# Patient Record
Sex: Male | Born: 1970 | Race: White | Hispanic: No | Marital: Married | Smoking: Former smoker
Health system: Southern US, Community
[De-identification: ages and names within clinical notes are randomized; demographics above are authoritative.]

## PROBLEM LIST (undated history)

## (undated) DIAGNOSIS — E0590 Thyrotoxicosis, unspecified without thyrotoxic crisis or storm: Secondary | ICD-10-CM

## (undated) DIAGNOSIS — E785 Hyperlipidemia, unspecified: Secondary | ICD-10-CM

## (undated) DIAGNOSIS — T7840XA Allergy, unspecified, initial encounter: Secondary | ICD-10-CM

## (undated) DIAGNOSIS — E042 Nontoxic multinodular goiter: Secondary | ICD-10-CM

## (undated) DIAGNOSIS — B159 Hepatitis A without hepatic coma: Secondary | ICD-10-CM

## (undated) DIAGNOSIS — A6000 Herpesviral infection of urogenital system, unspecified: Secondary | ICD-10-CM

## (undated) HISTORY — PX: FOOT SURGERY: SHX648

## (undated) HISTORY — PX: TONSILLECTOMY: SUR1361

## (undated) HISTORY — DX: Thyrotoxicosis, unspecified without thyrotoxic crisis or storm: E05.90

## (undated) HISTORY — DX: Nontoxic multinodular goiter: E04.2

## (undated) HISTORY — DX: Hepatitis a without hepatic coma: B15.9

## (undated) HISTORY — DX: Herpesviral infection of urogenital system, unspecified: A60.00

## (undated) HISTORY — DX: Allergy, unspecified, initial encounter: T78.40XA

---
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
## 2005-04-29 ENCOUNTER — Emergency Department (HOSPITAL_COMMUNITY): Admission: EM | Admit: 2005-04-29 | Discharge: 2005-04-29 | Payer: Self-pay | Admitting: Emergency Medicine

## 2005-06-03 ENCOUNTER — Ambulatory Visit: Payer: Self-pay | Admitting: Internal Medicine

## 2005-06-29 ENCOUNTER — Ambulatory Visit: Payer: Self-pay | Admitting: Internal Medicine

## 2005-08-12 ENCOUNTER — Ambulatory Visit: Payer: Self-pay | Admitting: Internal Medicine

## 2006-03-08 ENCOUNTER — Ambulatory Visit: Payer: Self-pay | Admitting: Endocrinology

## 2006-07-08 ENCOUNTER — Ambulatory Visit: Payer: Self-pay | Admitting: Internal Medicine

## 2007-01-11 ENCOUNTER — Ambulatory Visit: Payer: Self-pay | Admitting: Pulmonary Disease

## 2007-01-14 ENCOUNTER — Ambulatory Visit: Payer: Self-pay | Admitting: Family Medicine

## 2007-01-25 ENCOUNTER — Ambulatory Visit: Payer: Self-pay | Admitting: Internal Medicine

## 2007-01-25 LAB — CONVERTED CEMR LAB
T3, Free: 3.4 pg/mL (ref 2.3–4.2)
T4, Total: 8.1 ug/dL (ref 5.0–12.5)

## 2007-03-07 ENCOUNTER — Encounter: Admission: RE | Admit: 2007-03-07 | Discharge: 2007-03-07 | Payer: Self-pay | Admitting: Internal Medicine

## 2007-03-15 ENCOUNTER — Encounter: Admission: RE | Admit: 2007-03-15 | Discharge: 2007-03-15 | Payer: Self-pay | Admitting: Internal Medicine

## 2007-04-06 ENCOUNTER — Encounter: Admission: RE | Admit: 2007-04-06 | Discharge: 2007-04-06 | Payer: Self-pay | Admitting: Internal Medicine

## 2007-04-06 ENCOUNTER — Other Ambulatory Visit: Admission: RE | Admit: 2007-04-06 | Discharge: 2007-04-06 | Payer: Self-pay | Admitting: Interventional Radiology

## 2007-04-06 ENCOUNTER — Encounter (INDEPENDENT_AMBULATORY_CARE_PROVIDER_SITE_OTHER): Payer: Self-pay | Admitting: Interventional Radiology

## 2007-05-12 ENCOUNTER — Encounter: Payer: Self-pay | Admitting: *Deleted

## 2007-05-12 DIAGNOSIS — A6000 Herpesviral infection of urogenital system, unspecified: Secondary | ICD-10-CM | POA: Insufficient documentation

## 2007-05-12 DIAGNOSIS — J309 Allergic rhinitis, unspecified: Secondary | ICD-10-CM | POA: Insufficient documentation

## 2007-05-12 HISTORY — DX: Herpesviral infection of urogenital system, unspecified: A60.00

## 2007-05-18 ENCOUNTER — Encounter: Payer: Self-pay | Admitting: Internal Medicine

## 2007-05-18 ENCOUNTER — Ambulatory Visit: Payer: Self-pay | Admitting: Internal Medicine

## 2007-05-18 DIAGNOSIS — E0590 Thyrotoxicosis, unspecified without thyrotoxic crisis or storm: Secondary | ICD-10-CM

## 2007-05-18 HISTORY — DX: Thyrotoxicosis, unspecified without thyrotoxic crisis or storm: E05.90

## 2007-06-07 ENCOUNTER — Encounter: Payer: Self-pay | Admitting: Endocrinology

## 2007-06-07 ENCOUNTER — Ambulatory Visit: Payer: Self-pay | Admitting: Endocrinology

## 2007-08-15 ENCOUNTER — Telehealth (INDEPENDENT_AMBULATORY_CARE_PROVIDER_SITE_OTHER): Payer: Self-pay | Admitting: *Deleted

## 2007-08-17 ENCOUNTER — Ambulatory Visit: Payer: Self-pay | Admitting: Endocrinology

## 2007-08-18 ENCOUNTER — Telehealth (INDEPENDENT_AMBULATORY_CARE_PROVIDER_SITE_OTHER): Payer: Self-pay | Admitting: *Deleted

## 2007-08-23 ENCOUNTER — Encounter: Admission: RE | Admit: 2007-08-23 | Discharge: 2007-08-23 | Payer: Self-pay | Admitting: Endocrinology

## 2008-06-07 IMAGING — US US SOFT TISSUE HEAD/NECK
1 series · 14 of 25 positions shown · non-contrast
Comparison: Thyroid scan, 03/08/07.

CLINICAL DATA: Thyroid nodule and decreased TSH.
 THYROID ULTRASOUND:
TECHNIQUE: Ultrasound examination of the thyroid gland and adjacent soft tissue structures was performed.

[Series 1: us soft tissue head/neck · 0.07mm/px · 14 of 77 slices shown]
[im 1/77]
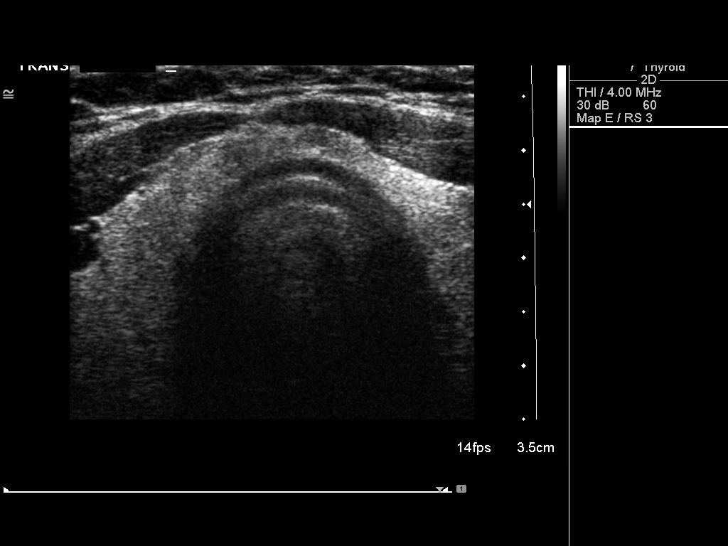
[im 7/77]
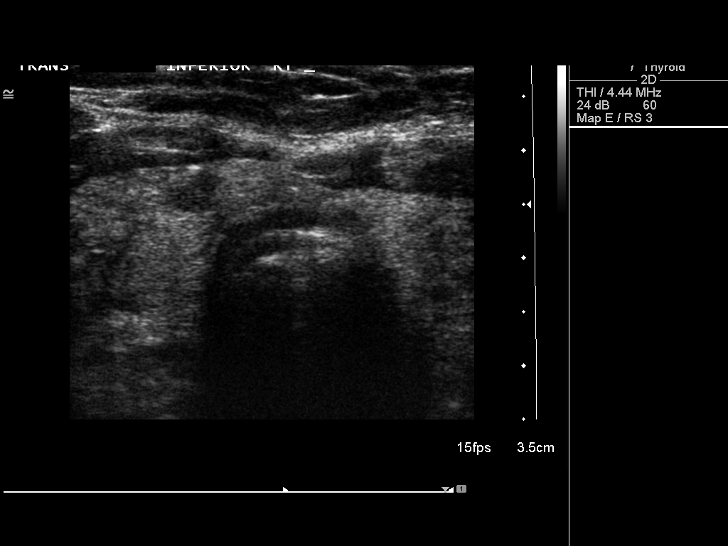
[im 13/77]
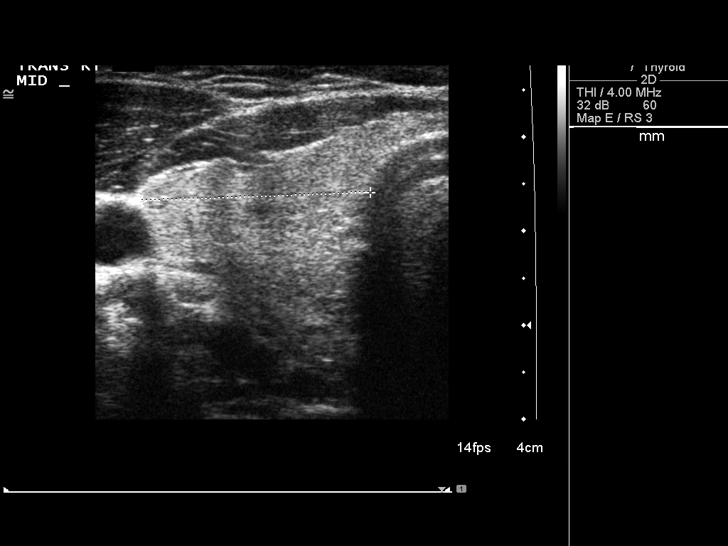
[im 20/77]
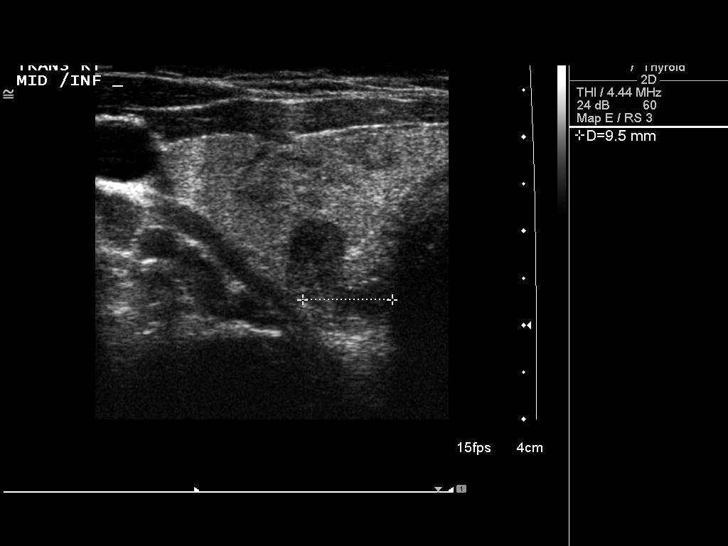
[im 26/77]
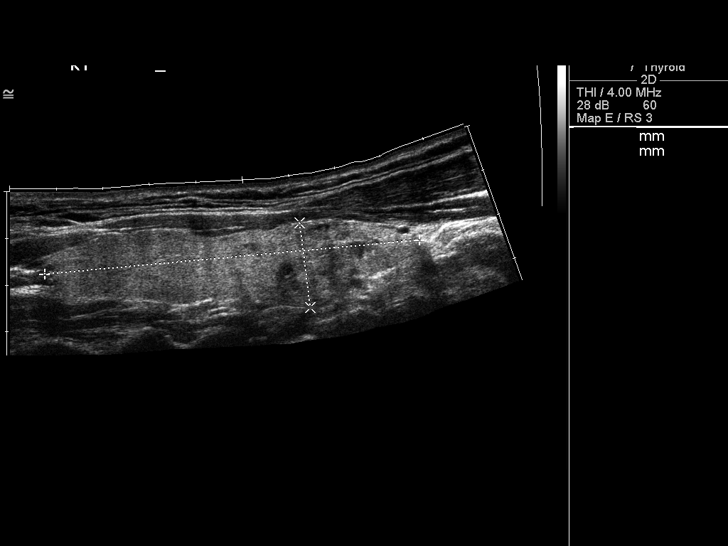
[im 29/77]
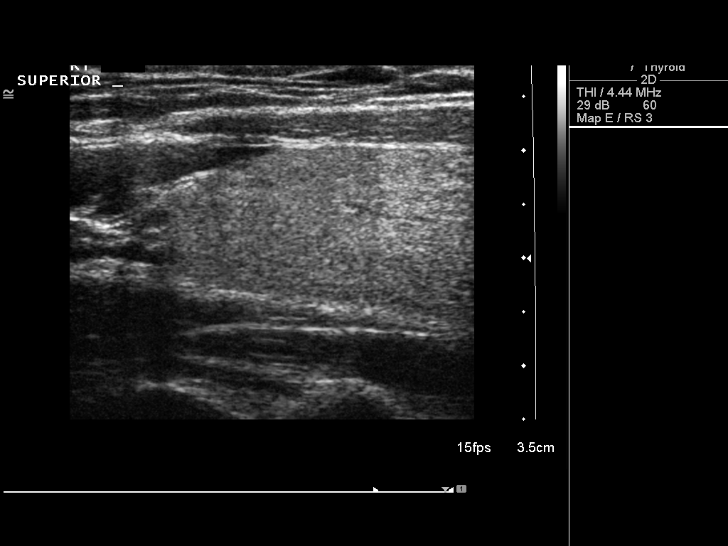
[im 35/77]
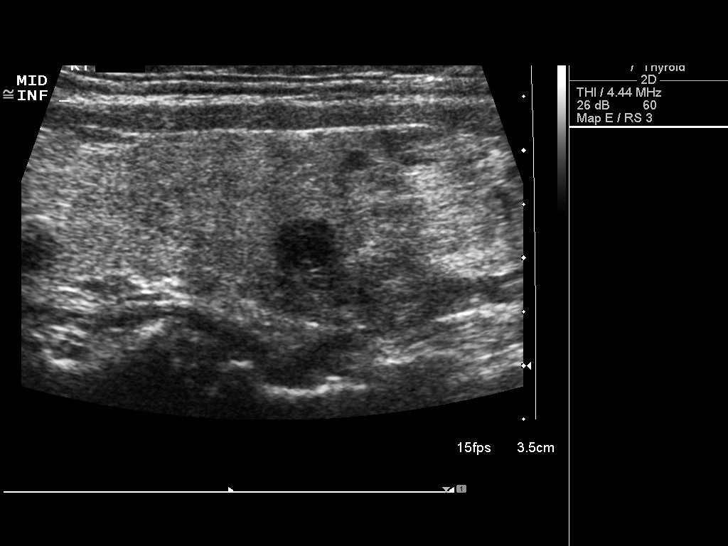
[im 42/77]
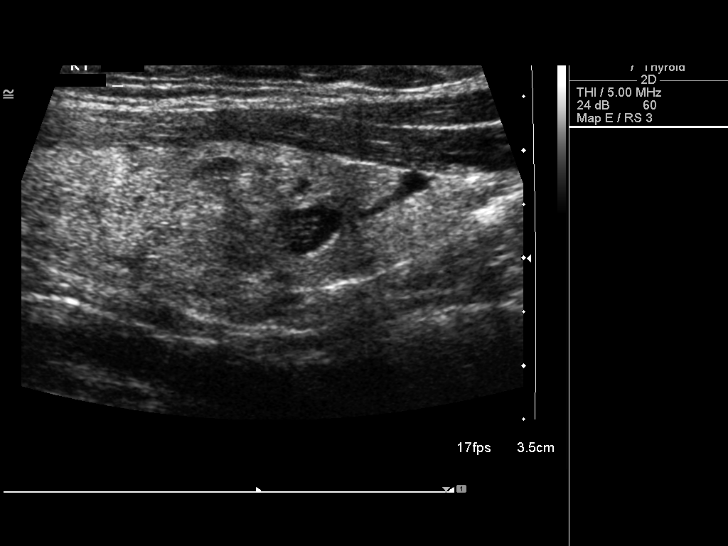
[im 48/77]
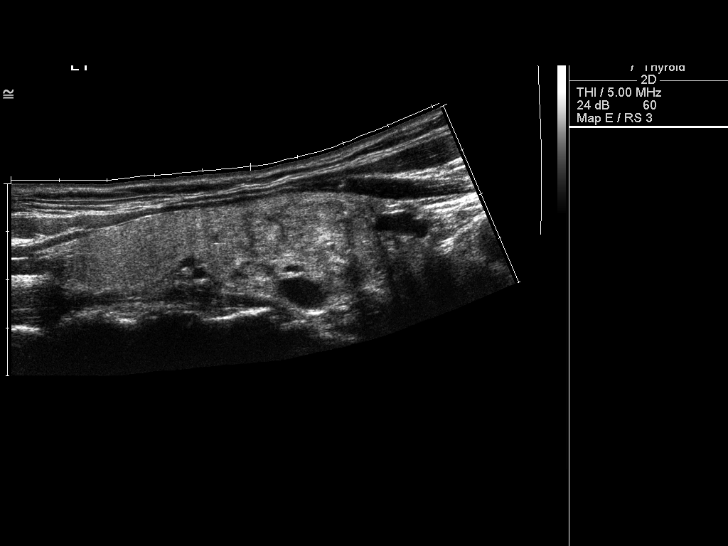
[im 51/77]
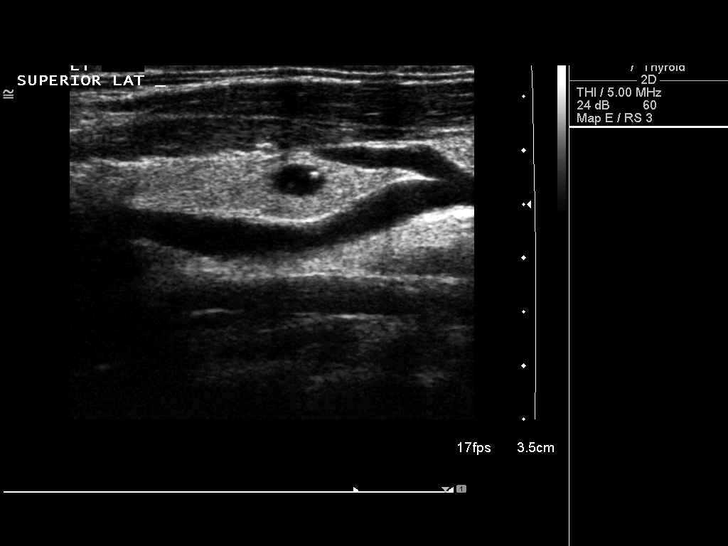
[im 58/77]
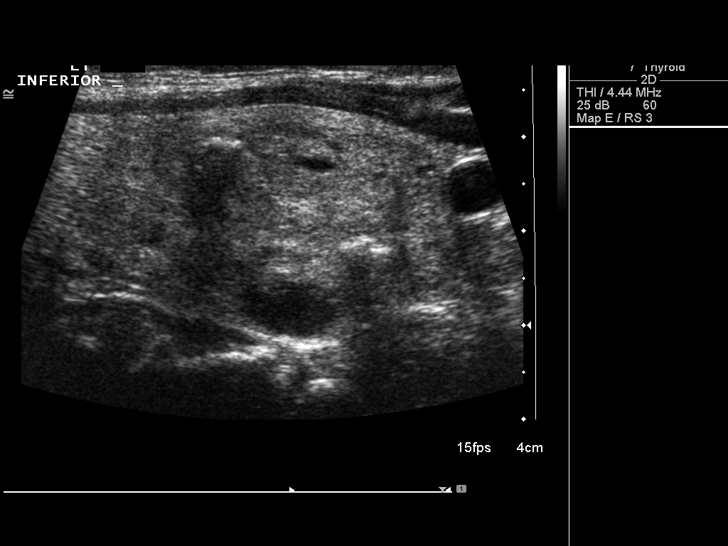
[im 64/77]
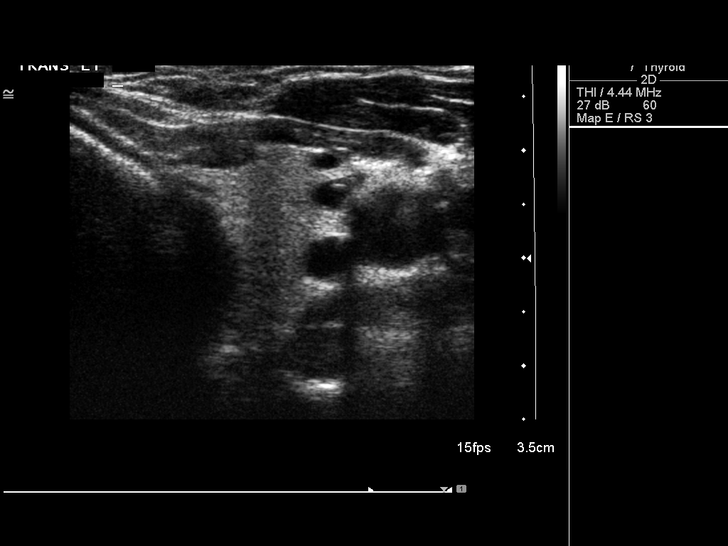
[im 70/77]
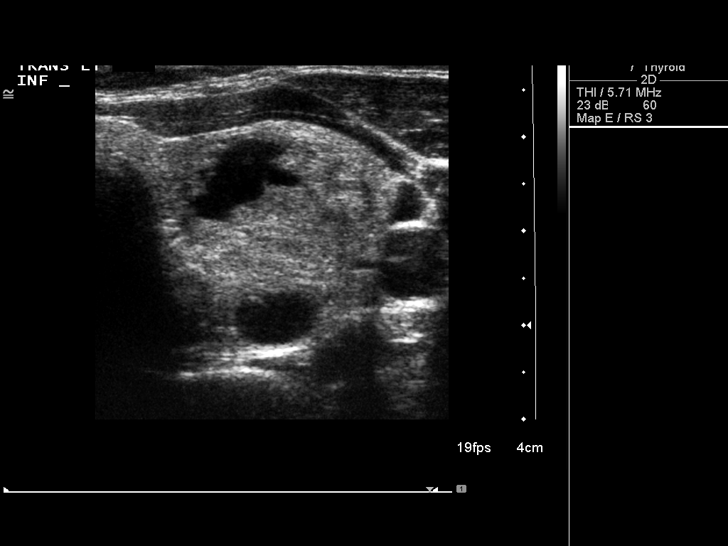
[im 77/77]
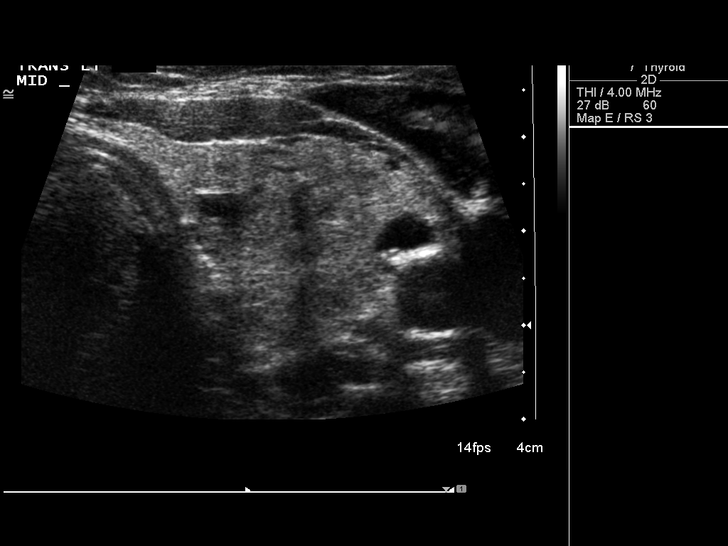

[14 of 25 positions shown; findings below may reference images not displayed]

FINDINGS: The right lobe measures 8.1 x 1.8 x 2.4 cm.  Left lobe is 6.9 x 2.5 x 2.8 cm.  Isthmus measures 3 mm.  There are several subcentimeter nodules bilaterally as well as within the isthmus.  Dominant right lower pole nodule measures 1.9 x 1.3 x 1.7 cm and is hypoechoic.  Dominant lower pole left nodule measures 3.1 x 2.3 x 2.2 cm and is heterogeneously hypoechoic.  These dominant lower pole lesions likely correspond to photopenic defects seen in the same areas seen on recent thyroid scan of 03/08/07.
IMPRESSION: Dominant lower pole hypoechoic nodules bilaterally.  These appear to correspond to photopenic defects on recent thyroid scan.  Percutaneous biopsy recommended.

## 2008-06-29 IMAGING — US US BIOPSY
1 series · 13 of 14 positions shown · non-contrast
Comparison: Thyroid ultrasound performed 03/15/07 ? [HOSPITAL] at [HOSPITAL].

CLINICAL DATA: Bilateral thyroid nodules.  Request to perform fine needle aspiration of both right and left lobes.

[Series 1: us biopsy · 14 acquisitions, 13 frames shown]
[im 1/14]
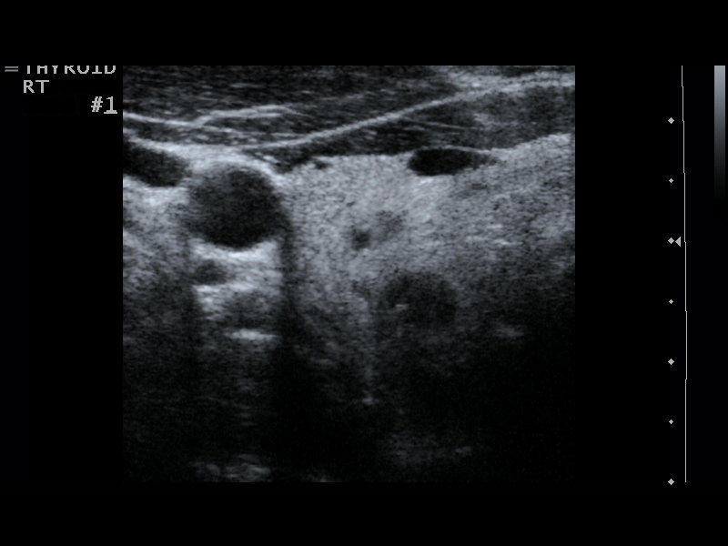
[im 2/14]
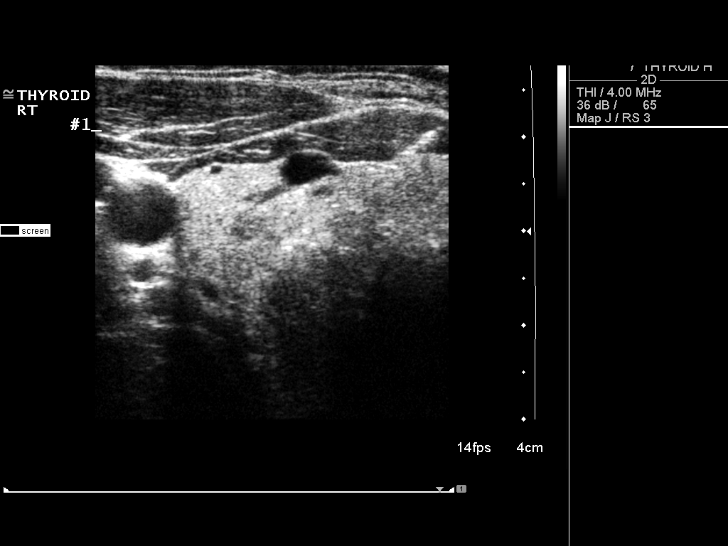
[im 3/14]
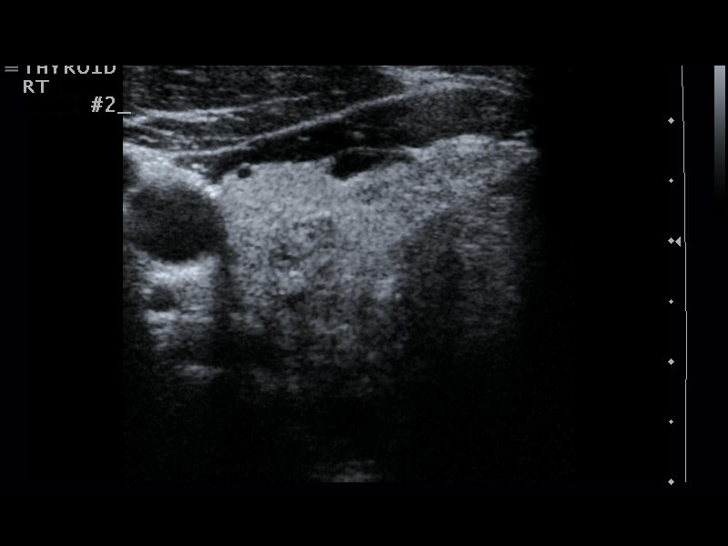
[im 4/14]
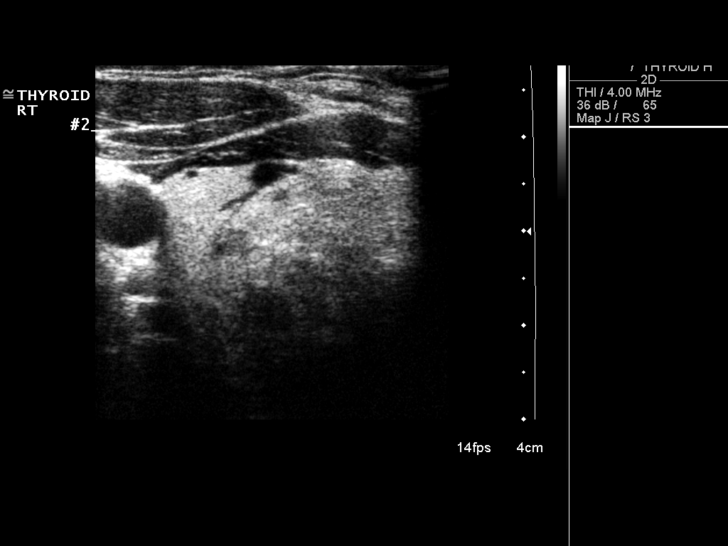
[im 5/14]
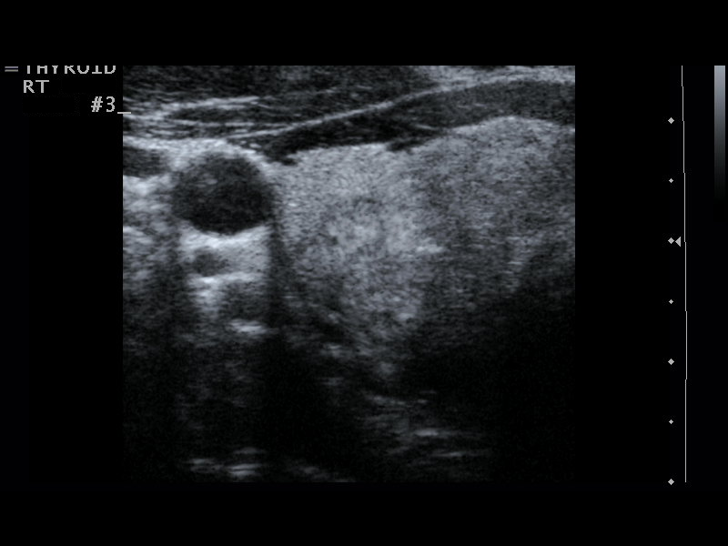
[im 6/14]
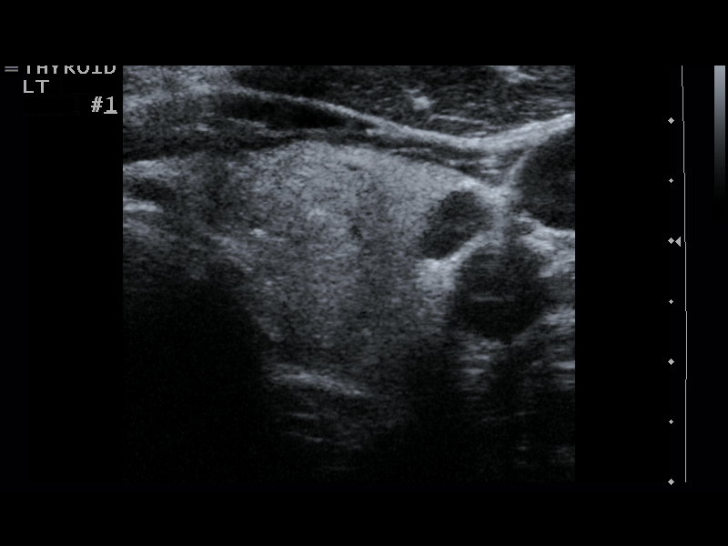
[im 8/14]
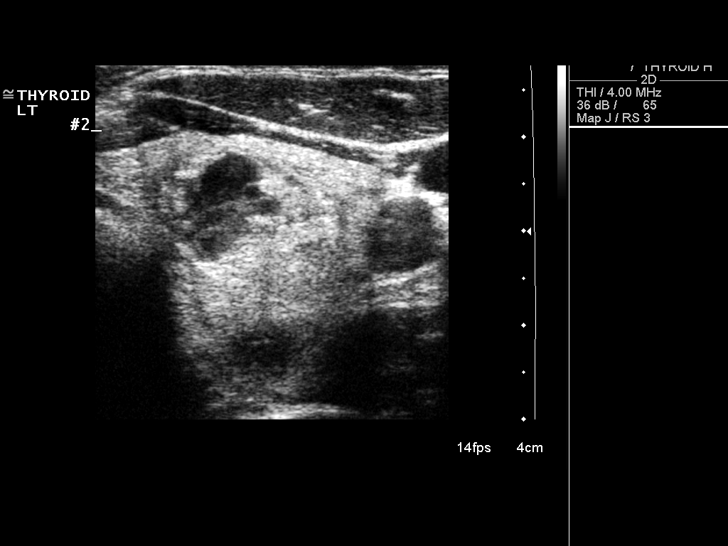
[im 9/14]
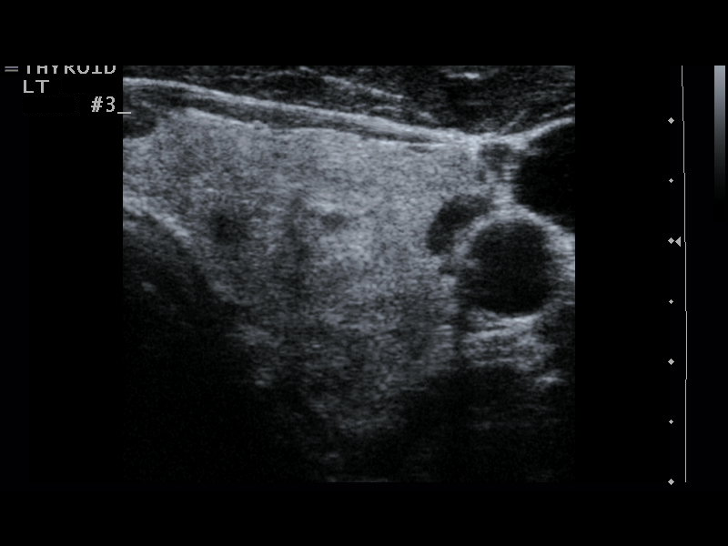
[im 10/14]
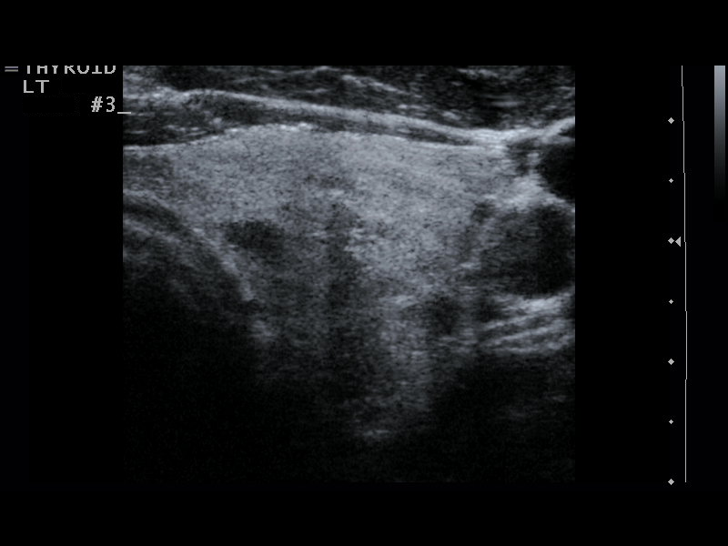
[im 11/14]
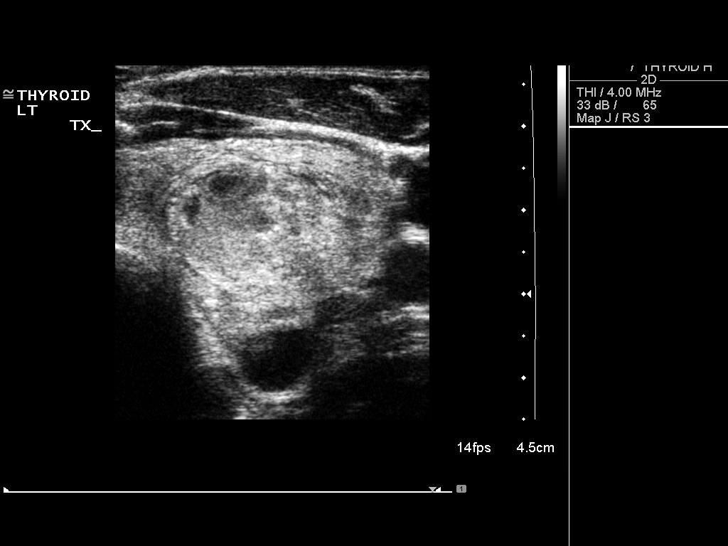
[im 12/14]
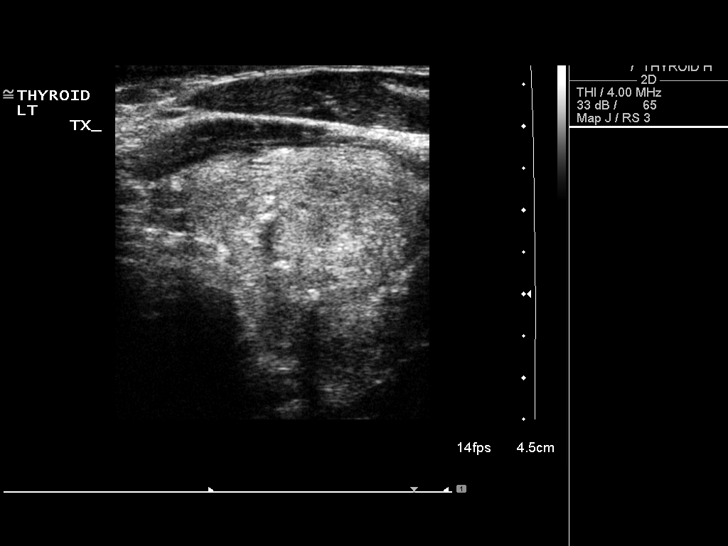
[im 13/14]
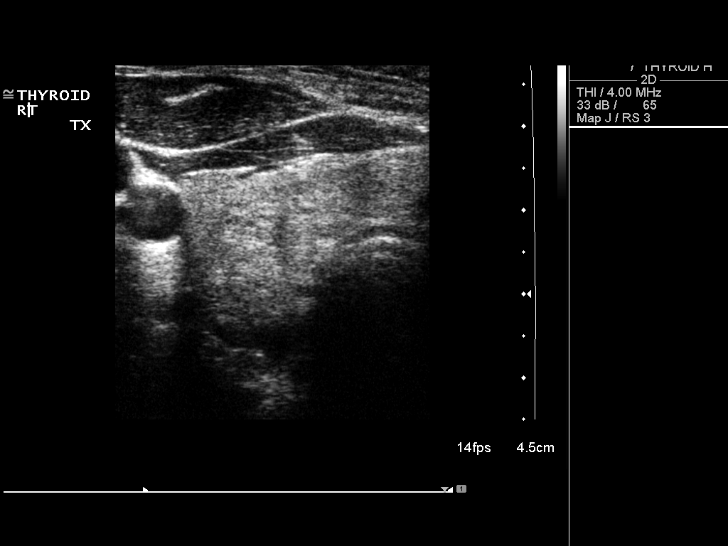
[im 14/14]
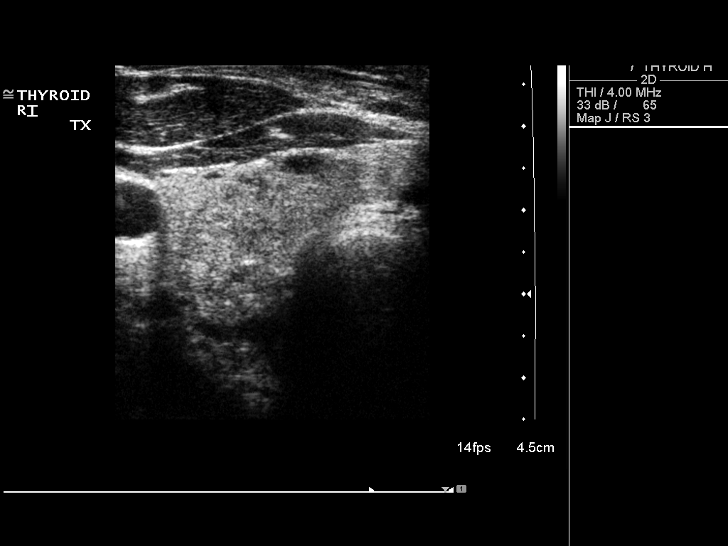

[13 of 14 positions shown; findings below may reference images not displayed]

Nuclear medicine thyroid scan and radioiodine uptake, 03/08/07 ? [HOSPITAL] at [HOSPITAL].
 ULTRASOUND-GUIDED FINE NEEDLE ASPIRATION, RIGHT LOBE OF THYROID:
FINDINGS: The above procedure was thoroughly discussed with the patient and written informed consent was obtained. 
 Ultrasound was then performed to localize and mark an adequate site for the biopsy.  The patient was then prepped and draped in a normal sterile fashion.  1% lidocaine was used for local anesthesia.  Using direct ultrasound guidance, three passes were made using a 25 gauge hypodermic needle into the dominant nodule located within the inferior aspect of the right lobe of the thyroid measuring approximately 1.9 cm in maximum diameter.  Ultrasound confirmed placement of the needle on all three occasions.  The specimens were given to Pathology for further analysis.  Post procedure imaging demonstrated no hematoma or immediate complication.  The patient tolerated the procedure well.
 ULTRASOUND-GUIDED FINE NEEDLE ASPIRATION, LEFT LOBE OF THYROID:
FINDINGS: The above procedure was thoroughly discussed with the patient and written informed consent was obtained. 
 Ultrasound was then performed to localize and mark an adequate site for the biopsy.  The patient was then prepped and draped in a normal sterile fashion.  1% lidocaine was used for local anesthesia.  Using direct ultrasound guidance, three passes were made using a 25 gauge hypodermic needle into the dominant nodule located within the inferior aspect of the left lobe of the thyroid measuring approximately 3.1 cm in maximum diameter.  Ultrasound confirmed placement of the needle on all three occasions.  The specimens were given to Pathology for further analysis.  Post procedure imaging demonstrated no hematoma or immediate complication.  The patient tolerated the procedure well.
IMPRESSION: 1.  Ultrasound-guided fine needle aspiration dominant nodule, right thyroid.
 2.  Ultrasound-guided fine needle aspiration left dominant nodule.  Final pathology on both pending.

## 2008-09-27 ENCOUNTER — Ambulatory Visit: Payer: Self-pay | Admitting: Endocrinology

## 2008-09-27 DIAGNOSIS — E042 Nontoxic multinodular goiter: Secondary | ICD-10-CM

## 2008-09-27 HISTORY — DX: Nontoxic multinodular goiter: E04.2

## 2008-11-16 IMAGING — NM NM THYROID UPTAKE SINGLE (24 HR)
1 series · 1 of 1 positions shown · non-contrast
Comparison: 03/15/07.

CLINICAL DATA: 36 year old with hyperthyroidism. 
NM THYROID UPTAKE 24 HOUR:
TECHNIQUE: Following the per oral administration of the radiopharmaceutical, 24 hour radioactive iodine uptake was calculated with the uptake probe entered on the neck. (31777)
Radiopharmaceutical:  5.8 uCi Y-U3U Abslm

[st statics, dual detec · 2.33mm/px · 1 of 1 slices shown]
[im 1/1]
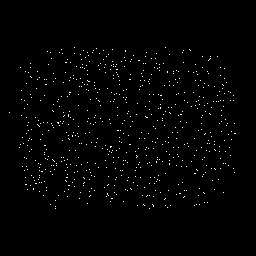

[1 of 1 positions shown; findings below may reference images not displayed]

FINDINGS: The patient?s 24 hour uptake was 8%.  Normal  is 10 to 35%.   On the previous examination it was 25.6%.
IMPRESSION: 24 hour Y-U3U uptake of 8%.

## 2010-08-30 ENCOUNTER — Encounter: Payer: Self-pay | Admitting: Endocrinology

## 2010-08-30 ENCOUNTER — Encounter: Payer: Self-pay | Admitting: Internal Medicine

## 2010-12-22 NOTE — Assessment & Plan Note (Signed)
[facility]                             PULMONARY OFFICE NOTE   NAME:Branch, LOGAN BALTIMORE                    MRN:          [id number]  DATE:01/11/2007                            DOB:          [birth year]    HISTORY OF PRESENT ILLNESS:  The patient is a 40 year old white male  patient of Dr. Artist Pais, who has a known history of rhinitis and HSV2,  presents today with a three-day history of left ear pain, body aches and  chills.  The patient reports he had similar problems about two weeks  ago, but they resolved.  The patient denies any nasal congestion, sinus  pain or pressure, cough, shortness of breath, recent travel or wheezing.   PAST MEDICAL HISTORY:  Reviewed.   CURRENT MEDICATIONS:  Reviewed.   PHYSICAL EXAMINATION:  GENERAL:  The patient is in no acute distress.  He is afebrile with stable vital signs.  HEENT:  Nasal mucosa is normal.  The posterior oropharynx is clear.  Right TM and EAC are clear.  Left TM is erythematous and bulging.  NECK:  Supple without adenopathy.  No JVD present.  LUNGS:  Sounds are clear.  CARDIAC:  Regular rate.  ABDOMEN:  Soft.  EXTREMITIES:  Warm without any edema.   IMPRESSION/PLAN:  Acute left otitis media.  The patient is to begin  Omnicef x7 days.  Take Tylenol, Advil as needed.  The patient is to  return back with Dr. Artist Pais as scheduled or sooner if needed.      Rubye Oaks, NP  Electronically Signed      Lonzo Cloud. Kriste Basque, MD  Electronically Signed   TP/MedQ  DD: 01/11/2007  DT: 01/11/2007  Job #: [id number]

## 2010-12-22 NOTE — Assessment & Plan Note (Signed)
[facility]                                 ON-CALL NOTE   NAME:Curtis Sanders, Curtis Sanders                      MRN:          [id number]  DATE:01/14/2007                            DOB:          [birth year]    TIME OF CALL:  January 14, 2007 at 8:45 a.m. approximately.   PHONE NUMBER:  [phone].   Patient of Dr. Artist Pais.   CHIEF COMPLAINT:  Ear infection, not better.  I called the number 4  times over the period of 20 minutes.  There was no answer, just an  answering machine.  I left them the message to call back with another  phone number or to go to the emergency room if there was an emergency.     Marne A. Tower, MD  Electronically Signed    MAT/MedQ  DD: 01/14/2007  DT: 01/14/2007  Job #: [id number]   cc:   Barbette Hair. Artist Pais, DO

## 2018-03-02 ENCOUNTER — Encounter: Payer: Self-pay | Admitting: Family Medicine

## 2018-03-02 ENCOUNTER — Ambulatory Visit: Payer: Self-pay | Admitting: Family Medicine

## 2018-03-02 VITALS — BP 114/80 | HR 76 | Temp 98.6°F | Ht 73.0 in | Wt 244.8 lb

## 2018-03-02 DIAGNOSIS — E042 Nontoxic multinodular goiter: Secondary | ICD-10-CM | POA: Diagnosis not present

## 2018-03-02 DIAGNOSIS — Z13220 Encounter for screening for lipoid disorders: Secondary | ICD-10-CM

## 2018-03-02 DIAGNOSIS — Z0000 Encounter for general adult medical examination without abnormal findings: Secondary | ICD-10-CM | POA: Diagnosis not present

## 2018-03-02 DIAGNOSIS — E041 Nontoxic single thyroid nodule: Secondary | ICD-10-CM | POA: Diagnosis not present

## 2018-03-02 DIAGNOSIS — Z0001 Encounter for general adult medical examination with abnormal findings: Secondary | ICD-10-CM | POA: Insufficient documentation

## 2018-03-02 LAB — COMPREHENSIVE METABOLIC PANEL
ALBUMIN: 4.2 g/dL (ref 3.5–5.2)
ALT: 21 U/L (ref 0–53)
AST: 14 U/L (ref 0–37)
Alkaline Phosphatase: 80 U/L (ref 39–117)
BUN: 19 mg/dL (ref 6–23)
CO2: 27 mEq/L (ref 19–32)
CREATININE: 0.93 mg/dL (ref 0.40–1.50)
Calcium: 9.2 mg/dL (ref 8.4–10.5)
Chloride: 106 mEq/L (ref 96–112)
GFR: 91.63 mL/min (ref >60.00)
GLUCOSE: 102 mg/dL — AB (ref 70–99)
Potassium: 4.1 mEq/L (ref 3.5–5.1)
SODIUM: 141 meq/L (ref 135–145)
TOTAL PROTEIN: 7.2 g/dL (ref 6.0–8.3)
Total Bilirubin: 0.3 mg/dL (ref 0.2–1.2)

## 2018-03-02 LAB — LIPID PANEL
Cholesterol: 200 mg/dL (ref 0–200)
HDL: 35.2 mg/dL — ABNORMAL LOW (ref >39.00)
LDL Cholesterol: 129 mg/dL — ABNORMAL HIGH (ref 0–99)
NonHDL: 165.2
Total CHOL/HDL Ratio: 6
Triglycerides: 179 mg/dL — ABNORMAL HIGH (ref 0.0–149.0)
VLDL: 35.8 mg/dL (ref 0.0–40.0)

## 2018-03-02 LAB — CBC
HEMATOCRIT: 44.7 % (ref 39.0–52.0)
Hemoglobin: 15 g/dL (ref 13.0–17.0)
MCHC: 33.5 g/dL (ref 30.0–36.0)
MCV: 79.9 fl (ref 78.0–100.0)
Platelets: 256 10*3/uL (ref 150.0–400.0)
RBC: 5.6 Mil/uL (ref 4.22–5.81)
RDW: 14.5 % (ref 11.5–15.5)
WBC: 6 10*3/uL (ref 4.0–10.5)

## 2018-03-02 LAB — TSH: TSH: 0.18 u[IU]/mL — AB (ref 0.35–4.50)

## 2018-03-02 LAB — T4, FREE: FREE T4: 0.93 ng/dL (ref 0.60–1.60)

## 2018-03-02 LAB — T3, FREE: T3, Free: 3.5 pg/mL (ref 2.3–4.2)

## 2018-03-02 NOTE — Assessment & Plan Note (Signed)
Well adult Orders Placed This Encounter  Procedures  . US Soft Tissue Head/Neck    Standing Status:   Future    Standing Expiration Date:   05/04/2019    Order Specific Question:   Reason for Exam (SYMPTOM  OR DIAGNOSIS REQUIRED)    Answer:   thyroid nodule    Order Specific Question:   Preferred imaging location?    Answer:   [facility]  . Comp Met (CMET)  . CBC  . Lipid Profile  . TSH  . T4, free  . T3, free  Immunizations:  He believes he is up to date Screenings:  Lipid screening.  He is concerned about colon cancer screening, no first degree relative with colon cancer.  Discussed recommended screening at age 55.   Risk factor reduction/Anticipatory Guidance:  Per AVS

## 2018-03-02 NOTE — Progress Notes (Signed)
Curtis Sanders - 47 y.o. male MRN [id number]  Date of birth: 06/28/1971  Subjective Chief Complaint  Patient presents with  . Establish Care    CPE, needs thyroid checked and blood work    HPI Curtis Sanders is a 47 y.o. male with history of hyperthyroidism and goiter here today to establish with new PCP.  He requests updated lab work including f/u of thyroid function.  He tells me that several years ago he was scheduled to have thyroid biopsy however this was canceled and never rescheduled. He is unsure why. He also would like to discuss colon cancer screening.  He denies any symptoms or family history of colon cancer.  Admits to some stress with work, but nothing too overwhelming.  He is a Educational psychologist at [employer].  Review of Systems  Constitutional: Negative for chills, fever, malaise/fatigue and weight loss.  HENT: Negative for congestion, ear pain and sore throat.   Eyes: Negative for blurred vision, double vision and pain.  Respiratory: Negative for cough and shortness of breath.   Cardiovascular: Negative for chest pain and palpitations.  Gastrointestinal: Negative for abdominal pain, blood in stool, constipation, heartburn and nausea.  Genitourinary: Negative for dysuria and urgency.  Musculoskeletal: Negative for joint pain and myalgias.  Neurological: Negative for dizziness and headaches.  Endo/Heme/Allergies: Does not bruise/bleed easily.  Psychiatric/Behavioral: Negative for depression. The patient is not nervous/anxious and does not have insomnia.      No Known Allergies  Past Medical History:  Diagnosis Date  . Allergy   . GENITAL HERPES 05/12/2007   Qualifier: Diagnosis of  By: Marca Ancona RMA, Lucy    . GOITER, MULTINODULAR 09/27/2008   Qualifier: Diagnosis of  By: Loanne Drilling MD, Jacelyn Pi   . Hepatitis A   . HYPERTHYROIDISM 05/18/2007   Qualifier: Diagnosis of  By: Wynona Luna     Past Surgical History:  Procedure Laterality Date  . TONSILLECTOMY      Social  History   Socioeconomic History  . Marital status: Married    Spouse name: Not on file  . Number of children: Not on file  . Years of education: Not on file  . Highest education level: Not on file  Occupational History  . Not on file  Social Needs  . Financial resource strain: Not on file  . Food insecurity:    Worry: Not on file    Inability: Not on file  . Transportation needs:    Medical: Not on file    Non-medical: Not on file  Tobacco Use  . Smoking status: Not on file  Substance and Sexual Activity  . Alcohol use: Not on file  . Drug use: Not on file  . Sexual activity: Not on file  Lifestyle  . Physical activity:    Days per week: Not on file    Minutes per session: Not on file  . Stress: Not on file  Relationships  . Social connections:    Talks on phone: Not on file    Gets together: Not on file    Attends religious service: Not on file    Active member of club or organization: Not on file    Attends meetings of clubs or organizations: Not on file    Relationship status: Not on file  Other Topics Concern  . Not on file  Social History Narrative  . Not on file    History reviewed. No pertinent family history.  Health Maintenance  Topic Date  Due  . HIV Screening  10/04/1983  . INFLUENZA VACCINE  03/09/2018  . TETANUS/TDAP  09/27/2018    ----------------------------------------------------------------------------------------------------------------------------------------------------------------------------------------------------------------- Physical Exam BP 114/80 (BP Location: Left Arm, Patient Position: Sitting, Cuff Size: Normal)   Pulse 76   Temp 98.6 F (37 C) (Oral)   Ht _0  (1.854 m)   Wt 244 lb 12.8 oz (111 kg)   SpO2 97%   BMI 32.30 kg/m   Physical Exam  Constitutional: He is oriented to person, place, and time. He appears well-nourished. No distress.  HENT:  Head: Normocephalic and atraumatic.  Right Ear: External ear normal.    Left Ear: External ear normal.  Mouth/Throat: Oropharynx is clear and moist.  Eyes: No scleral icterus.  Neck: Neck supple. Thyromegaly (slight goiter) present.  Cardiovascular: Normal rate, regular rhythm and normal heart sounds.  Pulmonary/Chest: Effort normal and breath sounds normal.  Abdominal: Soft. Bowel sounds are normal. He exhibits no distension. No hernia.  Musculoskeletal: He exhibits no edema.  Lymphadenopathy:    He has no cervical adenopathy.  Neurological: He is alert and oriented to person, place, and time. No cranial nerve deficit. Coordination normal.  Skin: Skin is warm and dry. No rash noted.  Psychiatric: He has a normal mood and affect. His behavior is normal.    ------------------------------------------------------------------------------------------------------------------------------------------------------------------------------------------------------------------- Assessment and Plan  GOITER, MULTINODULAR Update TFT's Updated [place] ordered.   Well adult exam Well adult Orders Placed This Encounter  Procedures  . US Soft Tissue Head/Neck    Standing Status:   Future    Standing Expiration Date:   05/04/2019    Order Specific Question:   Reason for Exam (SYMPTOM  OR DIAGNOSIS REQUIRED)    Answer:   thyroid nodule    Order Specific Question:   Preferred imaging location?    Answer:   [facility]  . Comp Met (CMET)  . CBC  . Lipid Profile  . TSH  . T4, free  . T3, free  Immunizations:  He believes he is up to date Screenings:  Lipid screening.  He is concerned about colon cancer screening, no first degree relative with colon cancer.  Discussed recommended screening at age 66.   Risk factor reduction/Anticipatory Guidance:  Per AVS

## 2018-03-02 NOTE — Assessment & Plan Note (Signed)
Update TFT's Updated [place] ordered.

## 2018-03-02 NOTE — Patient Instructions (Signed)

## 2018-03-03 NOTE — Addendum Note (Signed)
Addended by: Perlie Mayo on: 03/03/2018 09:54 AM   Modules accepted: Orders

## 2018-03-08 NOTE — Progress Notes (Signed)
-  TSH is low with normal T3 and T4.  We'll see what his US shows as well but would recommend referral back to endocrinology.  -LDL is elevated.  Current risk for development of heart disease is fairly low but I would recommend that he follow a low fat diet with regular exercise.   -Other labs look ok.

## 2018-03-13 ENCOUNTER — Ambulatory Visit
Admission: RE | Admit: 2018-03-13 | Discharge: 2018-03-13 | Disposition: A | Discharge status: Home / Self Care | Payer: BC Managed Care – PPO | Source: Ambulatory Visit | Attending: Family Medicine | Admitting: Family Medicine

## 2018-03-13 DIAGNOSIS — E042 Nontoxic multinodular goiter: Secondary | ICD-10-CM

## 2018-03-14 NOTE — Progress Notes (Signed)
Thyroid ultrasound unchanged since previous.  No further evaluation needed at this time.

## 2018-09-26 ENCOUNTER — Ambulatory Visit: Payer: BC Managed Care – PPO | Admitting: Internal Medicine

## 2018-10-03 ENCOUNTER — Ambulatory Visit: Payer: BC Managed Care – PPO | Admitting: Internal Medicine

## 2018-11-14 ENCOUNTER — Telehealth: Payer: Self-pay | Admitting: Internal Medicine

## 2018-11-14 NOTE — Telephone Encounter (Signed)
Patient has not transferred over to [facility] yet.  Please advise.

## 2018-11-14 NOTE — Telephone Encounter (Signed)
Unfortunately, we have not yet established a doctor pt relationship of any kind;  I would suggest a Nurse Visit at Dr Weston Settle office, as this should be relatively easy to do for all parties concerned

## 2018-11-14 NOTE — Telephone Encounter (Signed)
Copied from [place] [phone]. Topic: General - Other >> Nov 14, 2018  8:48 AM Alanda Slim E wrote: Reason for CRM: Pt wife called and states that Pt needs the pneumonia shot. Pt is [place] and in [place] and [place] they are requesting that people get the shot at this time so Pt would like the shot/ please advise / wife(Frances Janeece Riggers) cb# [phone] she has concerns and wants them to have this shot/ Dr. Judi Cong agreed to take and see this Pt

## 2018-11-14 NOTE — Telephone Encounter (Signed)
I have advised spouse.

## 2018-11-20 ENCOUNTER — Telehealth: Payer: Self-pay

## 2018-11-20 NOTE — Telephone Encounter (Signed)
Dr. Zigmund Daniel please advise. Pt wife called in wanting to set up appointment for pneumonia vaccine. I advised pt I would make an appointment for this but I would need to get an ok from you first. Pt wife said that his Dr. In [place] advised pt to get this done.

## 2018-11-21 NOTE — Telephone Encounter (Signed)
Appointment cancelled. Pt aware that Dr. Rodena Piety does not advise this vaccine at this time.

## 2018-11-21 NOTE — Telephone Encounter (Signed)
I don't see that he has any conditions that we would typically advise pneumovax for.  It is usually given in >65 OR if they have certain immune compromising/high risk condition such as heart, liver, kidney or lung disease, HIV, spleen removal, diabetes, or smoking.

## 2018-11-23 ENCOUNTER — Ambulatory Visit: Payer: BC Managed Care – PPO

## 2019-02-07 ENCOUNTER — Telehealth: Payer: BC Managed Care – PPO | Admitting: Nurse Practitioner

## 2019-02-07 ENCOUNTER — Other Ambulatory Visit: Payer: BC Managed Care – PPO

## 2019-02-07 ENCOUNTER — Telehealth (INDEPENDENT_AMBULATORY_CARE_PROVIDER_SITE_OTHER): Payer: BC Managed Care – PPO | Admitting: Family Medicine

## 2019-02-07 ENCOUNTER — Encounter: Payer: Self-pay | Admitting: Family Medicine

## 2019-02-07 ENCOUNTER — Telehealth: Payer: Self-pay | Admitting: *Deleted

## 2019-02-07 ENCOUNTER — Ambulatory Visit: Payer: Self-pay | Admitting: *Deleted

## 2019-02-07 DIAGNOSIS — R6889 Other general symptoms and signs: Secondary | ICD-10-CM | POA: Diagnosis not present

## 2019-02-07 DIAGNOSIS — Z20822 Contact with and (suspected) exposure to covid-19: Secondary | ICD-10-CM

## 2019-02-07 MED ORDER — BENZONATATE 100 MG PO CAPS: 100.0000 mg | ORAL_CAPSULE | Freq: Three times a day (TID) | ORAL | 0 refills | Status: DC | PRN

## 2019-02-07 NOTE — Progress Notes (Signed)
E-Visit for Corona Virus Screening   Your current symptoms could be consistent with the coronavirus.  Call your health care provider or local health department to request and arrange formal testing. Many health care providers can now test patients at their office but not all are.  Please quarantine yourself while awaiting your test results.  [place] [phone], [place], [place] or visit [email or web address]     COVID-19 is a respiratory illness with symptoms that are similar to the flu. Symptoms are typically mild to moderate, but there have been cases of severe illness and death due to the virus. The following symptoms may appear 2-14 days after exposure: . Fever . Cough . Shortness of breath or difficulty breathing . Chills . Repeated shaking with chills . Muscle pain . Headache . Sore throat . New loss of taste or smell . Fatigue . Congestion or runny nose . Nausea or vomiting . Diarrhea  It is vitally important that if you feel that you have an infection such as this virus or any other virus that you stay home and away from places where you may spread it to others.  You should self-quarantine for 14 days if you have symptoms that could potentially be coronavirus or have been in close contact a with a person diagnosed with COVID-19 within the last 2 weeks. You should avoid contact with people age 64 and older.   You should wear a mask or cloth face covering over your nose and mouth if you must be around other people or animals, including pets (even at home). Try to stay at least 6 feet away from other people. This will protect the people around you.  You can use medication such as A prescription cough medication called Tessalon Perles 100 mg. You may take 1-2 capsules every 8 hours as needed for cough  You may also take  acetaminophen (Tylenol) as needed for fever.   Reduce your risk of any infection by using the same precautions used for avoiding the common cold or flu:  Marland Kitchen Wash your hands often with soap and warm water for at least 20 seconds.  If soap and water are not readily available, use an alcohol-based hand sanitizer with at least 60% alcohol.  . If coughing or sneezing, cover your mouth and nose by coughing or sneezing into the elbow areas of your shirt or coat, into a tissue or into your sleeve (not your hands). . Avoid shaking hands with others and consider head nods or verbal greetings only. . Avoid touching your eyes, nose, or mouth with unwashed hands.  . Avoid close contact with people who are sick. . Avoid places or events with large numbers of people in one location, like concerts or sporting events. . Carefully consider travel plans you have or are making. . If you are planning any travel outside or inside the [place], visit the CDC's Travelers' Health webpage for the latest health notices. . If you have some symptoms but not all symptoms, continue to monitor at home and seek medical attention if your symptoms worsen. . If you are having a medical emergency, call 911.  HOME CARE . Only take medications as instructed by your medical team. . Drink plenty of fluids and get plenty of rest. . A steam or ultrasonic humidifier can help if you have congestion.   GET HELP RIGHT AWAY IF YOU HAVE EMERGENCY WARNING SIGNS** FOR COVID-19. If you or someone is showing  any of these signs seek emergency medical care immediately. Call 911 or proceed to your closest emergency facility if: . You develop worsening high fever. . Trouble breathing . Bluish lips or face . Persistent pain or pressure in the chest . New confusion . Inability to wake or stay awake . You cough up blood. . Your symptoms become more severe  **This list is not all possible symptoms. Contact your medical provider for any symptoms that are  sever or concerning to you.   MAKE SURE YOU   Understand these instructions.  Will watch your condition.  Will get help right away if you are not doing well or get worse.  Your e-visit answers were reviewed by a board certified advanced clinical practitioner to complete your personal care plan.  Depending on the condition, your plan could have included both over the counter or prescription medications.  If there is a problem please reply once you have received a response from your provider.  Your safety is important to [place].  If you have drug allergies check your prescription carefully.    You can use MyChart to ask questions about today's visit, request a non-urgent call back, or ask for a work or school excuse for 24 hours related to this e-Visit. If it has been greater than 24 hours you will need to follow up with your provider, or enter a new e-Visit to address those concerns. You will get an e-mail in the next two days asking about your experience.  I hope that your e-visit has been valuable and will speed your recovery. Thank you for using e-visits.  5-10 minutes spent reviewing and documenting in chart.

## 2019-02-07 NOTE — Telephone Encounter (Signed)
Pt called stating he has a temp of 99.0 on today at 0800; he also complains of headache, fatigue, and chills;  he would like a COVID test; recommendations made per nurse triage protocol; he verbalized understanding; he normally sees Dr Luetta Nutting, [facility]; pt transferred to [facility] for scheduling.  Reason for Disposition . HIGH RISK patient (e.g., age > 44 years, diabetes, heart or lung disease, weak immune system)  Answer Assessment - Initial Assessment Questions 1. COVID-19 DIAGNOSIS: "Who made your Coronavirus (COVID-19) diagnosis?" "Was it confirmed by a positive lab test?" If not diagnosed by a HCP, ask "Are there lots of cases (community spread) where you live?" (See public health department website, if unsure)     Major community spread 2. ONSET: "When did the COVID-19 symptoms start?"      02/07/2019 3. WORST SYMPTOM: "What is your worst symptom?" (e.g., cough, fever, shortness of breath, muscle aches)    fever 4. COUGH: "Do you have a cough?" If so, ask: "How bad is the cough?"       no 5. FEVER: "Do you have a fever?" If so, ask: "What is your temperature, how was it measured, and when did it start?"    Oral 02/07/2019 at 0800 6. RESPIRATORY STATUS: "Describe your breathing?" (e.g., shortness of breath, wheezing, unable to speak)     Breathing normal 7. BETTER-SAME-WORSE: "Are you getting better, staying the same or getting worse compared to yesterday?"  If getting worse, ask, "In what way?"     Worse, feels feverish and down 8. HIGH RISK DISEASE: "Do you have any chronic medical problems?" (e.g., asthma, heart or lung disease, weak immune system, etc.)     Thyroid issue 9. PREGNANCY: "Is there any chance you are pregnant?" "When was your last menstrual period?"    n/a 10. OTHER SYMPTOMS: "Do you have any other symptoms?"  (e.g., chills, fatigue, headache, loss of smell or taste, muscle pain, sore throat)       Chills, fatigue, headache rated 2 out of 10  Protocols used:  CORONAVIRUS (COVID-19) DIAGNOSED OR SUSPECTED-A-AH

## 2019-02-07 NOTE — Telephone Encounter (Signed)
Scheduled patient for testing at [facility] today at 1:30 pm.  Testing protocol reviewed.

## 2019-02-07 NOTE — Assessment & Plan Note (Signed)
Recommend testing for COVID given current symptoms.  Discussed that other viral illnesses can cause similar symptoms as well.  Recommend tylenol as needed for fever/aches/pain.  Push fluids and rest.  Discussed that he should self isolate until test results return.  If negative he should continue to quarantine for a period of 10 days. If positive we discussed that he should remain isolated for at least 10 days since onset and fever free/improving respiratory symptoms >72 hours.  Also recommend that family members get tested in positive as well.  Will enroll in mychart monitoring.

## 2019-02-07 NOTE — Telephone Encounter (Signed)
Pt was seen via MyChart today . Closing task.

## 2019-02-07 NOTE — Telephone Encounter (Signed)
-----   Message from Luetta Nutting, DO sent at 02/07/2019 10:43 AM EDT ----- Regarding: Covid Test Patient: Curtis Sanders DOB: 10/03/1975 MRN: [id number] Reason for test:  Elevated temp, chills, sweats, headache. Insurance info: BCBS ID: [id number] GRP: [id number]  Thanks!

## 2019-02-07 NOTE — Progress Notes (Signed)
Curtis Sanders - 48 y.o. male MRN [id number]  Date of birth: [birth year]   This visit type was conducted due to national recommendations for restrictions regarding the COVID-19 Pandemic (e.g. social distancing).  This format is felt to be most appropriate for this patient at this time.  All issues noted in this document were discussed and addressed.  No physical exam was performed (except for noted visual exam findings with Video Visits).  I discussed the limitations of evaluation and management by telemedicine and the availability of in person appointments. The patient expressed understanding and agreed to proceed.  I connected with@ on 02/07/19 at 10:30 AM EDT by a video enabled telemedicine application and verified that I am speaking with the correct person using two identifiers.   Patient Location: Home [address] [place] [address]   Provider location:   [facility]  Chief Complaint  Patient presents with   Possible COVID Exposue    Fever 99.0/ Chills /sweats/Ha Onset : 10-36 hrs    HPI  Curtis Sanders is a 47 y.o. male who presents via audio/video conferencing for a telehealth visit today.  Reports possible symptoms related to COVID including fever with chills and sweats.  He has also had headache.  Symptoms started yesterday afternoon, initially with headache.  Highest recorded temp at home was 99 but this was after ibuprofen and he thinks it has been higher.  No known exposure but he does do all of the errands and grocery shopping for the family.  He does typically wear a mask.  He denies respiratory symptoms at this time, nausea, vomiting, diarrhea.   ROS:  A comprehensive ROS was completed and negative except as noted per HPI  Past Medical History:  Diagnosis Date   Allergy    GENITAL HERPES 05/12/2007   Qualifier: Diagnosis of  By: Marca Ancona RMA, [place], MULTINODULAR 09/27/2008   Qualifier: Diagnosis of  By: Loanne Drilling MD, Sean A    Hepatitis A     HYPERTHYROIDISM 05/18/2007   Qualifier: Diagnosis of  By: Wynona Luna     Past Surgical History:  Procedure Laterality Date   TONSILLECTOMY      Family History  Problem Relation Age of Onset   Cancer Mother    Hearing loss Mother    Asthma Daughter    Alcohol abuse Maternal Grandmother    Arthritis Maternal Grandmother    Hearing loss Maternal Grandmother    Stroke Maternal Grandfather     Social History   Socioeconomic History   Marital status: Married    Spouse name: Not on file   Number of children: Not on file   Years of education: Not on file   Highest education level: Not on file  Occupational History   Not on file  Social Needs   Financial resource strain: Not on file   Food insecurity    Worry: Not on file    Inability: Not on file   Transportation needs    Medical: Not on file    Non-medical: Not on file  Tobacco Use   Smoking status: Never Smoker   Smokeless tobacco: Never Used  Substance and Sexual Activity   Alcohol use: Yes   Drug use: Never   Sexual activity: Not on file  Lifestyle   Physical activity    Days per week: Not on file    Minutes per session: Not on file   Stress: Not on file  Relationships  Social Herbalist on phone: Not on file    Gets together: Not on file    Attends religious service: Not on file    Active member of club or organization: Not on file    Attends meetings of clubs or organizations: Not on file    Relationship status: Not on file   Intimate partner violence    Fear of current or ex partner: Not on file    Emotionally abused: Not on file    Physically abused: Not on file    Forced sexual activity: Not on file  Other Topics Concern   Not on file  Social History Narrative   Not on file     Current Outpatient Medications:    benzonatate (TESSALON PERLES) 100 MG capsule, Take 1 capsule (100 mg total) by mouth 3 (three) times daily as needed., Disp: 20 capsule,  Rfl: 0  EXAM:  VITALS per patient if applicable: Temp 89.3 F (36.9 C) (Tympanic)    Ht 6\' 1"  (1.854 m)    Wt 244 lb (110.7 kg)    BMI 32.19 kg/m   GENERAL: alert, oriented, appears well and in no acute distress  HEENT: atraumatic, conjunttiva clear, no obvious abnormalities on inspection of external nose and ears  NECK: normal movements of the head and neck  LUNGS: on inspection no signs of respiratory distress, breathing rate appears normal, no obvious gross SOB, gasping or wheezing  CV: no obvious cyanosis  MS: moves all visible extremities without noticeable abnormality  PSYCH/NEURO: pleasant and cooperative, no obvious depression or anxiety, speech and thought processing grossly intact  ASSESSMENT AND PLAN:  Discussed the following assessment and plan:  Suspected Covid-19 Virus Infection Recommend testing for COVID given current symptoms.  Discussed that other viral illnesses can cause similar symptoms as well.  Recommend tylenol as needed for fever/aches/pain.  Push fluids and rest.  Discussed that he should self isolate until test results return.  If negative he should continue to quarantine for a period of 10 days. If positive we discussed that he should remain isolated for at least 10 days since onset and fever free/improving respiratory symptoms >72 hours.  Also recommend that family members get tested in positive as well.  Will enroll in mychart monitoring.        I discussed the assessment and treatment plan with the patient. The patient was provided an opportunity to ask questions and all were answered. The patient agreed with the plan and demonstrated an understanding of the instructions.   The patient was advised to call back or seek an in-person evaluation if the symptoms worsen or if the condition fails to improve as anticipated.    Luetta Nutting, DO

## 2019-02-12 LAB — NOVEL CORONAVIRUS, NAA: SARS-CoV-2, NAA: NOT DETECTED

## 2019-02-18 ENCOUNTER — Ambulatory Visit (HOSPITAL_COMMUNITY)
Admission: EM | Admit: 2019-02-18 | Discharge: 2019-02-18 | Disposition: A | Discharge status: Home / Self Care | Payer: BC Managed Care – PPO | Attending: Physician Assistant | Admitting: Physician Assistant

## 2019-02-18 ENCOUNTER — Encounter (HOSPITAL_COMMUNITY): Payer: Self-pay

## 2019-02-18 ENCOUNTER — Other Ambulatory Visit: Payer: Self-pay

## 2019-02-18 DIAGNOSIS — M545 Low back pain, unspecified: Secondary | ICD-10-CM

## 2019-02-18 MED ORDER — PREDNISONE 50 MG PO TABS: 50.0000 mg | ORAL_TABLET | Freq: Every day | ORAL | 0 refills | Status: DC

## 2019-02-18 MED ORDER — METHOCARBAMOL 500 MG PO TABS: 500.0000 mg | ORAL_TABLET | Freq: Two times a day (BID) | ORAL | 0 refills | Status: DC

## 2019-02-18 NOTE — ED Provider Notes (Signed)
[facility]    CSN: [id number] Arrival date & time: 02/18/19  1227     History   Chief Complaint Chief Complaint  Patient presents with  . Back Pain    Lower    HPI Curtis Sanders is a 48 y.o. male.   48 year old male comes in for back pain for the past 2-3 days. States it was at first mild, to the mid lumbar region that radiates to bilateral sides. However, last night, could not get into comfortable position, and was in significant pain. Denies radiation down the leg. Denies numbness/tingling, saddle anesthesia, loss of bladder or bowel control. Denies urinary symptoms such as frequency, dysuria, hematuria. Denies injury/trauma, heavy lifting.      Past Medical History:  Diagnosis Date  . Allergy   . GENITAL HERPES 05/12/2007   Qualifier: Diagnosis of  By: Marca Ancona RMA, Lucy    . GOITER, MULTINODULAR 09/27/2008   Qualifier: Diagnosis of  By: Loanne Drilling MD, Jacelyn Pi   . Hepatitis A   . HYPERTHYROIDISM 05/18/2007   Qualifier: Diagnosis of  By: Wynona Luna     Patient Active Problem List   Diagnosis Date Noted  . Suspected Covid-19 Virus Infection 02/07/2019  . Well adult exam 03/02/2018  . GOITER, MULTINODULAR 09/27/2008  . HYPERTHYROIDISM 05/18/2007  . GENITAL HERPES 05/12/2007  . ALLERGIC RHINITIS 05/12/2007    Past Surgical History:  Procedure Laterality Date  . TONSILLECTOMY         Home Medications    Prior to Admission medications   Medication Sig Start Date End Date Taking? Authorizing Provider  methocarbamol (ROBAXIN) 500 MG tablet Take 1 tablet (500 mg total) by mouth 2 (two) times daily. 02/18/19   Tasia Catchings, Shubh Chiara V, PA-C  predniSONE (DELTASONE) 50 MG tablet Take 1 tablet (50 mg total) by mouth daily. 02/18/19   Ok Edwards, PA-C    Family History Family History  Problem Relation Age of Onset  . Cancer Mother   . Hearing loss Mother   . Asthma Daughter   . Alcohol abuse Maternal Grandmother   . Arthritis Maternal Grandmother   . Hearing  loss Maternal Grandmother   . Stroke Maternal Grandfather     Social History Social History   Tobacco Use  . Smoking status: Never Smoker  . Smokeless tobacco: Never Used  Substance Use Topics  . Alcohol use: Yes  . Drug use: Never     Allergies   Patient has no known allergies.   Review of Systems Review of Systems  Reason unable to perform ROS: See HPI as above.     Physical Exam Triage Vital Signs ED Triage Vitals [02/18/19 1256]  Enc Vitals Group     BP 113/74     Pulse Rate 85     Resp 16     Temp 98.5 F (36.9 C)     Temp Source Oral     SpO2 99 %     Weight      Height      Head Circumference      Peak Flow      Pain Score 2     Pain Loc      Pain Edu?      Excl. in [place]?    No data found.  Updated Vital Signs BP 113/74 (BP Location: Right Arm)   Pulse 85   Temp 98.5 F (36.9 C) (Oral)   Resp 16   SpO2 99%  Physical Exam Constitutional:      General: He is not in acute distress.    Appearance: He is well-developed. He is not diaphoretic.  HENT:     Head: Normocephalic and atraumatic.  Eyes:     Conjunctiva/sclera: Conjunctivae normal.     Pupils: Pupils are equal, round, and reactive to light.  Cardiovascular:     Rate and Rhythm: Normal rate and regular rhythm.     Heart sounds: Normal heart sounds. No murmur. No friction rub. No gallop.   Pulmonary:     Effort: Pulmonary effort is normal. No accessory muscle usage or respiratory distress.     Breath sounds: Normal breath sounds. No stridor. No decreased breath sounds, wheezing, rhonchi or rales.  Musculoskeletal:     Comments: No swelling, rash, contusion to the back. Patient points to the L4-5 midline when talking about pain, but no spinous process tenderness. Has tenderness to palpation along bilateral lumbar region around L4-5 region. Full ROM of back. Decreased active ROM of hips due to pain. Full passive ROM of hips, with no pain with passive ROM. Sensation intact and equal  bilaterally. Negative straight leg raise.  Skin:    General: Skin is warm and dry.  Neurological:     Mental Status: He is alert and oriented to person, place, and time.    UC Treatments / Results  Labs (all labs ordered are listed, but only abnormal results are displayed) Labs Reviewed - No data to display  EKG   Radiology No results found.  Procedures Procedures (including critical care time)  Medications Ordered in UC Medications - No data to display  Initial Impression / Assessment and Plan / UC Course  I have reviewed the triage vital signs and the nursing notes.  Pertinent labs & imaging results that were available during my care of the patient were reviewed by me and considered in my medical decision making (see chart for details).    Start prednisone as directed for pain and inflammation. Muscle relaxant as needed. Ice/heat compresses. Discussed with patient strain can take up to 3-4 weeks to resolve, but should be getting better each week. Return precautions given.   Final Clinical Impressions(s) / UC Diagnoses   Final diagnoses:  Acute bilateral low back pain without sciatica    ED Prescriptions    Medication Sig Dispense Auth. Provider   predniSONE (DELTASONE) 50 MG tablet Take 1 tablet (50 mg total) by mouth daily. 5 tablet Javaun Dimperio V, PA-C   methocarbamol (ROBAXIN) 500 MG tablet Take 1 tablet (500 mg total) by mouth 2 (two) times daily. 20 tablet Tobin Chad, [place] 02/18/19 [phone]

## 2019-02-18 NOTE — ED Triage Notes (Signed)
Pt C/o lower back pain. Pt states that certain movement hurts his back. Pt is woke up from the severe pain in his back. Pt denies any injury to his back

## 2019-02-18 NOTE — Discharge Instructions (Signed)
Start prednisone as directed. Robaxin as needed, this can make you drowsy, so do not take if you are going to drive, operate heavy machinery, or make important decisions. Ice/heat compresses as needed. This can take up to 3-4 weeks to completely resolve, but you should be feeling better each week. Follow up here or with PCP if symptoms worsen, changes for reevaluation. If experience numbness/tingling of the inner thighs, loss of bladder or bowel control, go to the emergency department for evaluation.

## 2019-02-27 ENCOUNTER — Telehealth: Payer: Self-pay | Admitting: Emergency Medicine

## 2019-02-27 ENCOUNTER — Other Ambulatory Visit (INDEPENDENT_AMBULATORY_CARE_PROVIDER_SITE_OTHER): Payer: BC Managed Care – PPO

## 2019-02-27 ENCOUNTER — Ambulatory Visit (INDEPENDENT_AMBULATORY_CARE_PROVIDER_SITE_OTHER): Payer: BC Managed Care – PPO | Admitting: Internal Medicine

## 2019-02-27 ENCOUNTER — Encounter: Payer: Self-pay | Admitting: Internal Medicine

## 2019-02-27 ENCOUNTER — Other Ambulatory Visit: Payer: Self-pay

## 2019-02-27 ENCOUNTER — Other Ambulatory Visit: Payer: Self-pay | Admitting: Internal Medicine

## 2019-02-27 VITALS — BP 128/82 | HR 76 | Temp 98.5°F | Ht 73.0 in | Wt 234.0 lb

## 2019-02-27 DIAGNOSIS — E559 Vitamin D deficiency, unspecified: Secondary | ICD-10-CM

## 2019-02-27 DIAGNOSIS — E785 Hyperlipidemia, unspecified: Secondary | ICD-10-CM

## 2019-02-27 DIAGNOSIS — E0590 Thyrotoxicosis, unspecified without thyrotoxic crisis or storm: Secondary | ICD-10-CM

## 2019-02-27 DIAGNOSIS — R739 Hyperglycemia, unspecified: Secondary | ICD-10-CM | POA: Insufficient documentation

## 2019-02-27 DIAGNOSIS — Z0000 Encounter for general adult medical examination without abnormal findings: Secondary | ICD-10-CM

## 2019-02-27 DIAGNOSIS — E611 Iron deficiency: Secondary | ICD-10-CM | POA: Diagnosis not present

## 2019-02-27 DIAGNOSIS — Z114 Encounter for screening for human immunodeficiency virus [HIV]: Secondary | ICD-10-CM

## 2019-02-27 DIAGNOSIS — Z125 Encounter for screening for malignant neoplasm of prostate: Secondary | ICD-10-CM

## 2019-02-27 DIAGNOSIS — E538 Deficiency of other specified B group vitamins: Secondary | ICD-10-CM

## 2019-02-27 HISTORY — DX: Hyperlipidemia, unspecified: E78.5

## 2019-02-27 LAB — URINALYSIS, ROUTINE W REFLEX MICROSCOPIC
Bilirubin Urine: NEGATIVE
Ketones, ur: NEGATIVE
Leukocytes,Ua: NEGATIVE
Nitrite: NEGATIVE
Specific Gravity, Urine: >=1.03 — AB (ref 1.000–1.030)
Total Protein, Urine: NEGATIVE
Urine Glucose: NEGATIVE
Urobilinogen, UA: 0.2 (ref 0.0–1.0)
pH: 6 (ref 5.0–8.0)

## 2019-02-27 LAB — CBC WITH DIFFERENTIAL/PLATELET
Basophils Absolute: 0 10*3/uL (ref 0.0–0.1)
Basophils Relative: 0.3 % (ref 0.0–3.0)
Eosinophils Absolute: 0.1 10*3/uL (ref 0.0–0.7)
Eosinophils Relative: 1.4 % (ref 0.0–5.0)
HCT: 47.7 % (ref 39.0–52.0)
Hemoglobin: 15.7 g/dL (ref 13.0–17.0)
Lymphocytes Relative: 28.5 % (ref 12.0–46.0)
Lymphs Abs: 1.7 10*3/uL (ref 0.7–4.0)
MCHC: 32.8 g/dL (ref 30.0–36.0)
MCV: 80.9 fl (ref 78.0–100.0)
Monocytes Absolute: 0.4 10*3/uL (ref 0.1–1.0)
Monocytes Relative: 7.2 % (ref 3.0–12.0)
Neutro Abs: 3.8 10*3/uL (ref 1.4–7.7)
Neutrophils Relative %: 62.6 % (ref 43.0–77.0)
Platelets: 284 10*3/uL (ref 150.0–400.0)
RBC: 5.89 Mil/uL — ABNORMAL HIGH (ref 4.22–5.81)
RDW: 14.6 % (ref 11.5–15.5)
WBC: 6 10*3/uL (ref 4.0–10.5)

## 2019-02-27 LAB — BASIC METABOLIC PANEL
BUN: 20 mg/dL (ref 6–23)
CO2: 27 mEq/L (ref 19–32)
Calcium: 9.3 mg/dL (ref 8.4–10.5)
Chloride: 105 mEq/L (ref 96–112)
Creatinine, Ser: 0.98 mg/dL (ref 0.40–1.50)
GFR: 80.83 mL/min (ref >60.00)
Glucose, Bld: 103 mg/dL — ABNORMAL HIGH (ref 70–99)
Potassium: 4.1 mEq/L (ref 3.5–5.1)
Sodium: 141 mEq/L (ref 135–145)

## 2019-02-27 LAB — LIPID PANEL
Cholesterol: 190 mg/dL (ref 0–200)
HDL: 39.7 mg/dL (ref >39.00)
NonHDL: 149.89
Total CHOL/HDL Ratio: 5
Triglycerides: 291 mg/dL — ABNORMAL HIGH (ref 0.0–149.0)
VLDL: 58.2 mg/dL — ABNORMAL HIGH (ref 0.0–40.0)

## 2019-02-27 LAB — HEMOGLOBIN A1C: Hgb A1c MFr Bld: 6 % (ref 4.6–6.5)

## 2019-02-27 LAB — VITAMIN B12: Vitamin B-12: 211 pg/mL (ref 211–911)

## 2019-02-27 LAB — IBC PANEL
Iron: 180 ug/dL — ABNORMAL HIGH (ref 42–165)
Saturation Ratios: 61.8 % — ABNORMAL HIGH (ref 20.0–50.0)
Transferrin: 208 mg/dL — ABNORMAL LOW (ref 212.0–360.0)

## 2019-02-27 LAB — HEPATIC FUNCTION PANEL
ALT: 14 U/L (ref 0–53)
AST: 13 U/L (ref 0–37)
Albumin: 4.5 g/dL (ref 3.5–5.2)
Alkaline Phosphatase: 69 U/L (ref 39–117)
Bilirubin, Direct: 0.1 mg/dL (ref 0.0–0.3)
Total Bilirubin: 0.8 mg/dL (ref 0.2–1.2)
Total Protein: 6.6 g/dL (ref 6.0–8.3)

## 2019-02-27 LAB — T3, FREE: T3, Free: 3.4 pg/mL (ref 2.3–4.2)

## 2019-02-27 LAB — TSH: TSH: 0.22 u[IU]/mL — ABNORMAL LOW (ref 0.35–4.50)

## 2019-02-27 LAB — PSA: PSA: 0.85 ng/mL (ref 0.10–4.00)

## 2019-02-27 LAB — VITAMIN D 25 HYDROXY (VIT D DEFICIENCY, FRACTURES): VITD: 22 ng/mL — ABNORMAL LOW (ref 30.00–100.00)

## 2019-02-27 LAB — T4, FREE: Free T4: 1.04 ng/dL (ref 0.60–1.60)

## 2019-02-27 LAB — LDL CHOLESTEROL, DIRECT: Direct LDL: 111 mg/dL

## 2019-02-27 MED ORDER — VITAMIN D (ERGOCALCIFEROL) 1.25 MG (50000 UNIT) PO CAPS: 50000.0000 [IU] | ORAL_CAPSULE | ORAL | 0 refills | Status: DC

## 2019-02-27 NOTE — Assessment & Plan Note (Signed)

## 2019-02-27 NOTE — Patient Instructions (Signed)
Please continue all other medications as before, and refills have been done if requested.  Please have the pharmacy call with any other refills you may need.  Please continue your efforts at being more active, low cholesterol diet, and weight control.  You are otherwise up to date with prevention measures today.  Please keep your appointments with your specialists as you may have planned  You will be contacted regarding the referral for: colonoscopy  Please go to the LAB in the Basement (turn left off the elevator) for the tests to be done today  You will be contacted by phone if any changes need to be made immediately.  Otherwise, you will receive a letter about your results with an explanation, but please check with MyChart first.  Please remember to sign up for MyChart if you have not done so, as this will be important to you in the future with finding out test results, communicating by private email, and scheduling acute appointments online when needed.  Please return in 1 year for your yearly visit, or sooner if needed, with Lab testing done 3-5 days before

## 2019-02-27 NOTE — Progress Notes (Signed)
Subjective:    Patient ID: Curtis Sanders, male    DOB: [birth year], 48 y.o.   MRN: [id number]  HPI  Here for wellness and f/u;  Overall doing ok;  Pt denies Chest pain, worsening SOB, DOE, wheezing, orthopnea, PND, worsening LE edema, palpitations, dizziness or syncope.  Pt denies neurological change such as new headache, facial or extremity weakness.  Pt denies polydipsia, polyuria, or low sugar symptoms. Pt states overall good compliance with treatment and medications, good tolerability, and has been trying to follow appropriate diet.  Pt denies worsening depressive symptoms, suicidal ideation or panic. No fever, night sweats, wt loss, loss of appetite, or other constitutional symptoms.  Pt states good ability with ADL's, has low fall risk, home safety reviewed and adequate, no other significant changes in hearing or vision, and only occasionally active with exercise. Seen at UC last wk with LBP now improved  No new complaints Past Medical History:  Diagnosis Date  . Allergy   . GENITAL HERPES 05/12/2007   Qualifier: Diagnosis of  By: Marca Ancona RMA, Lucy    . GOITER, MULTINODULAR 09/27/2008   Qualifier: Diagnosis of  By: Loanne Drilling MD, Jacelyn Pi   . Hepatitis A   . HYPERTHYROIDISM 05/18/2007   Qualifier: Diagnosis of  By: Wynona Luna    Past Surgical History:  Procedure Laterality Date  . TONSILLECTOMY      reports that he has never smoked. He has never used smokeless tobacco. He reports current alcohol use. He reports that he does not use drugs. family history includes Alcohol abuse in his maternal grandmother; Arthritis in his maternal grandmother; Asthma in his daughter; Cancer in his mother; Hearing loss in his maternal grandmother and mother; Stroke in his maternal grandfather. No Known Allergies Current Outpatient Medications on File Prior to Visit  Medication Sig Dispense Refill  . methocarbamol (ROBAXIN) 500 MG tablet Take 1 tablet (500 mg total) by mouth 2 (two) times daily. 20  tablet 0  . predniSONE (DELTASONE) 50 MG tablet Take 1 tablet (50 mg total) by mouth daily. 5 tablet 0   No current facility-administered medications on file prior to visit.    Review of Systems Constitutional: Negative for other unusual diaphoresis, sweats, appetite or weight changes HENT: Negative for other worsening hearing loss, ear pain, facial swelling, mouth sores or neck stiffness.   Eyes: Negative for other worsening pain, redness or other visual disturbance.  Respiratory: Negative for other stridor or swelling Cardiovascular: Negative for other palpitations or other chest pain  Gastrointestinal: Negative for worsening diarrhea or loose stools, blood in stool, distention or other pain Genitourinary: Negative for hematuria, flank pain or other change in urine volume.  Musculoskeletal: Negative for myalgias or other joint swelling.  Skin: Negative for other color change, or other wound or worsening drainage.  Neurological: Negative for other syncope or numbness. Hematological: Negative for other adenopathy or swelling Psychiatric/Behavioral: Negative for hallucinations, other worsening agitation, SI, self-injury, or new decreased concentration ALl other system neg per pt   Objective:   Physical Exam BP 128/82   Pulse 76   Temp 98.5 F (36.9 C) (Oral)   Ht 6\' 1"  (1.854 m)   Wt 234 lb (106.1 kg)   SpO2 96%   BMI 30.87 kg/m  VS noted,  Constitutional: Pt is oriented to person, place, and time. Appears well-developed and well-nourished, in no significant distress and comfortable Head: Normocephalic and atraumatic  Eyes: Conjunctivae and EOM are normal. Pupils are equal, round, and  reactive to light Right Ear: External ear normal without discharge Left Ear: External ear normal without discharge Nose: Nose without discharge or deformity Mouth/Throat: Oropharynx is without other ulcerations and moist  Neck: Normal range of motion. Neck supple. No JVD present. No tracheal  deviation present or significant neck LA or mass Cardiovascular: Normal rate, regular rhythm, normal heart sounds and intact distal pulses.   Pulmonary/Chest: WOB normal and breath sounds without rales or wheezing  Abdominal: Soft. Bowel sounds are normal. NT. No HSM  Musculoskeletal: Normal range of motion. Exhibits no edema Lymphadenopathy: Has no other cervical adenopathy.  Neurological: Pt is alert and oriented to person, place, and time. Pt has normal reflexes. No cranial nerve deficit. Motor grossly intact, Gait intact Skin: Skin is warm and dry. No rash noted or new ulcerations Psychiatric:  Has normal mood and affect. Behavior is normal without agitation No other exam findings Lab Results  Component Value Date   WBC 6.0 03/02/2018   HGB 15.0 03/02/2018   HCT 44.7 03/02/2018   PLT 256.0 03/02/2018   GLUCOSE 102 (H) 03/02/2018   CHOL 200 03/02/2018   TRIG 179.0 (H) 03/02/2018   HDL 35.20 (L) 03/02/2018   LDLCALC 129 (H) 03/02/2018   ALT 21 03/02/2018   AST 14 03/02/2018   NA 141 03/02/2018   K 4.1 03/02/2018   CL 106 03/02/2018   CREATININE 0.93 03/02/2018   BUN 19 03/02/2018   CO2 27 03/02/2018   TSH 0.18 (L) 03/02/2018          Assessment & Plan:

## 2019-02-27 NOTE — Assessment & Plan Note (Signed)
For lab f/u today, consider f/u with endo again

## 2019-02-27 NOTE — Assessment & Plan Note (Signed)
Goal ldl < 100, for lab f/u

## 2019-02-27 NOTE — Telephone Encounter (Signed)
Error

## 2019-02-27 NOTE — Assessment & Plan Note (Signed)
For a1c with labs

## 2019-02-28 LAB — HIV ANTIBODY (ROUTINE TESTING W REFLEX): HIV 1&2 Ab, 4th Generation: NONREACTIVE

## 2019-03-13 ENCOUNTER — Encounter: Payer: Self-pay | Admitting: Gastroenterology

## 2019-03-30 ENCOUNTER — Ambulatory Visit (AMBULATORY_SURGERY_CENTER): Payer: Self-pay | Admitting: *Deleted

## 2019-03-30 ENCOUNTER — Other Ambulatory Visit: Payer: Self-pay

## 2019-03-30 VITALS — Temp 95.7°F | Ht 73.0 in | Wt 234.0 lb

## 2019-03-30 DIAGNOSIS — Z1211 Encounter for screening for malignant neoplasm of colon: Secondary | ICD-10-CM

## 2019-03-30 MED ORDER — SUPREP BOWEL PREP KIT 17.5-3.13-1.6 GM/177ML PO SOLN: 1.0000 | Freq: Once | ORAL | 0 refills | Status: AC

## 2019-03-30 NOTE — Progress Notes (Signed)
No egg or soy allergy known to patient  No issues with past sedation with any surgeries  or procedures, no intubation problems  No diet pills per patient No home 02 use per patient  No blood thinners per patient  Pt denies issues with constipation  No A fib or A flutter  EMMI video sent to pt's e mail   Suprep $15 coupon to pt in PV today    

## 2019-04-06 ENCOUNTER — Encounter: Payer: Self-pay | Admitting: Gastroenterology

## 2019-04-12 ENCOUNTER — Telehealth: Payer: Self-pay

## 2019-04-12 NOTE — Telephone Encounter (Signed)
Covid-19 screening questions   Do you now or have you had a fever in the last 14 days?  Do you have any respiratory symptoms of shortness of breath or cough now or in the last 14 days?  Do you have any family members or close contacts with diagnosed or suspected Covid-19 in the past 14 days?  Have you been tested for Covid-19 and found to be positive?       

## 2019-04-13 ENCOUNTER — Ambulatory Visit (AMBULATORY_SURGERY_CENTER): Payer: BC Managed Care – PPO | Admitting: Gastroenterology

## 2019-04-13 ENCOUNTER — Other Ambulatory Visit: Payer: Self-pay

## 2019-04-13 ENCOUNTER — Encounter: Payer: Self-pay | Admitting: Gastroenterology

## 2019-04-13 VITALS — BP 110/73 | HR 55 | Temp 98.3°F | Resp 16 | Ht 73.0 in | Wt 234.0 lb

## 2019-04-13 DIAGNOSIS — D122 Benign neoplasm of ascending colon: Secondary | ICD-10-CM

## 2019-04-13 DIAGNOSIS — Z1211 Encounter for screening for malignant neoplasm of colon: Secondary | ICD-10-CM

## 2019-04-13 DIAGNOSIS — Z800 Family history of malignant neoplasm of digestive organs: Secondary | ICD-10-CM | POA: Diagnosis present

## 2019-04-13 DIAGNOSIS — K635 Polyp of colon: Secondary | ICD-10-CM | POA: Diagnosis not present

## 2019-04-13 DIAGNOSIS — D125 Benign neoplasm of sigmoid colon: Secondary | ICD-10-CM

## 2019-04-13 MED ORDER — SODIUM CHLORIDE 0.9 % IV SOLN: 500.0000 mL | Freq: Once | INTRAVENOUS | Status: DC

## 2019-04-13 NOTE — Progress Notes (Signed)
Temperature taken by J.B., VS taken by C.W. 

## 2019-04-13 NOTE — Op Note (Signed)
[name] Procedure Date: 04/13/2019 10:55 AM MRN: [id number] Endoscopist: Remo Lipps P. Havery Moros , MD Age: 48 Referring MD:  Date of Birth: [birth year] Gender: Male Account #: [id number] Procedure:                Colonoscopy Indications:              Screening patient at increased risk: Family history                            of 1st-degree relative with colorectal cancer -                            mother diagnosed age 5s, This is the patient's                            first colonoscopy Medicines:                Monitored Anesthesia Care Procedure:                Pre-Anesthesia Assessment:                           - Prior to the procedure, a History and Physical                            was performed, and patient medications and                            allergies were reviewed. The patient's tolerance of                            previous anesthesia was also reviewed. The risks                            and benefits of the procedure and the sedation                            options and risks were discussed with the patient.                            All questions were answered, and informed consent                            was obtained. Prior Anticoagulants: The patient has                            taken no previous anticoagulant or antiplatelet                            agents. ASA Grade Assessment: II - A patient with                            mild systemic disease. After reviewing the risks  and benefits, the patient was deemed in                            satisfactory condition to undergo the procedure.                           After obtaining informed consent, the colonoscope                            was passed under direct vision. Throughout the                            procedure, the patient's blood pressure, pulse, and                            oxygen saturations were monitored  continuously. The                            Colonoscope was introduced through the anus and                            advanced to the the cecum, identified by                            appendiceal orifice and ileocecal valve. The                            colonoscopy was performed without difficulty. The                            patient tolerated the procedure well. The quality                            of the bowel preparation was good. The ileocecal                            valve, appendiceal orifice, and rectum were                            photographed. Scope In: 10:58:57 AM Scope Out: 11:12:38 AM Scope Withdrawal Time: 0 hours 11 minutes 29 seconds  Total Procedure Duration: 0 hours 13 minutes 41 seconds  Findings:                 The perianal and digital rectal examinations were                            normal.                           A 4 mm polyp was found in the ascending colon. The                            polyp was flat. The polyp was removed with a cold  snare. Resection and retrieval were complete.                           A 3 mm polyp was found in the sigmoid colon. The                            polyp was sessile. The polyp was removed with a                            cold snare. Resection and retrieval were complete.                           Internal hemorrhoids were found during retroflexion.                           The exam was otherwise without abnormality. Complications:            No immediate complications. Estimated blood loss:                            Minimal. Estimated Blood Loss:     Estimated blood loss was minimal. Impression:               - One 4 mm polyp in the ascending colon, removed                            with a cold snare. Resected and retrieved.                           - One 3 mm polyp in the sigmoid colon, removed with                            a cold snare. Resected and retrieved.                            - Internal hemorrhoids.                           - The examination was otherwise normal. Recommendation:           - Patient has a contact number available for                            emergencies. The signs and symptoms of potential                            delayed complications were discussed with the                            patient. Return to normal activities tomorrow.                            Written discharge instructions were provided to the  patient.                           - Resume previous diet.                           - Continue present medications.                           - Await pathology results. Remo Lipps P. Armbruster, MD 04/13/2019 11:16:13 AM This report has been signed electronically.

## 2019-04-13 NOTE — Progress Notes (Signed)
caled patient's wife to give report and instruct to bring car to the front. Wife stating she had left and could be there is 2 minutes.  Patient reading epic report questioned "mild systemic disease" documented by Dr Wilma Flavin. Reviewed with patiient dx documented on snapshot. Patient agreeing with dx.

## 2019-04-13 NOTE — Progress Notes (Signed)
Pt's states no medical or surgical changes since previsit or office visit. 

## 2019-04-13 NOTE — Progress Notes (Signed)
A/ox3, pleased with MAC, report to RN 

## 2019-04-13 NOTE — Progress Notes (Signed)
Called to room to assist during endoscopic procedure.  Patient ID and intended procedure confirmed with present staff. Received instructions for my participation in the procedure from the performing physician.  

## 2019-04-13 NOTE — Patient Instructions (Signed)
HANDOUTS GIVEN;HEMORRHOIDS AND POLYPS   YOU HAD AN ENDOSCOPIC PROCEDURE TODAY AT [facility]:   Refer to the procedure report that was given to you for any specific questions about what was found during the examination.  If the procedure report does not answer your questions, please call your gastroenterologist to clarify.  If you requested that your care partner not be given the details of your procedure findings, then the procedure report has been included in a sealed envelope for you to review at your convenience later.  YOU SHOULD EXPECT: Some feelings of bloating in the abdomen. Passage of more gas than usual.  Walking can help get rid of the air that was put into your GI tract during the procedure and reduce the bloating. If you had a lower endoscopy (such as a colonoscopy or flexible sigmoidoscopy) you may notice spotting of blood in your stool or on the toilet paper. If you underwent a bowel prep for your procedure, you may not have a normal bowel movement for a few days.  Please Note:  You might notice some irritation and congestion in your nose or some drainage.  This is from the oxygen used during your procedure.  There is no need for concern and it should clear up in a day or so.  SYMPTOMS TO REPORT IMMEDIATELY:   Following lower endoscopy (colonoscopy or flexible sigmoidoscopy):  Excessive amounts of blood in the stool  Significant tenderness or worsening of abdominal pains  Swelling of the abdomen that is new, acute  Fever of 100F or higher   For urgent or emergent issues, a gastroenterologist can be reached at any hour by calling [phone].   DIET:  We do recommend a small meal at first, but then you may proceed to your regular diet.  Drink plenty of fluids but you should avoid alcoholic beverages for 24 hours.  ACTIVITY:  You should plan to take it easy for the rest of today and you should NOT DRIVE or use heavy machinery until tomorrow (because of the  sedation medicines used during the test).    FOLLOW UP: Our staff will call the number listed on your records 48-72 hours following your procedure to check on you and address any questions or concerns that you may have regarding the information given to you following your procedure. If we do not reach you, we will leave a message.  We will attempt to reach you two times.  During this call, we will ask if you have developed any symptoms of COVID 19. If you develop any symptoms (ie: fever, flu-like symptoms, shortness of breath, cough etc.) before then, please call [phone].  If you test positive for Covid 19 in the 2 weeks post procedure, please call and report this information to [place].    If any biopsies were taken you will be contacted by phone or by letter within the next 1-3 weeks.  Please call us at [phone] if you have not heard about the biopsies in 3 weeks.    SIGNATURES/CONFIDENTIALITY: You and/or your care partner have signed paperwork which will be entered into your electronic medical record.  These signatures attest to the fact that that the information above on your After Visit Summary has been reviewed and is understood.  Full responsibility of the confidentiality of this discharge information lies with you and/or your care-partner.

## 2019-04-18 ENCOUNTER — Telehealth: Payer: Self-pay

## 2019-04-18 NOTE — Telephone Encounter (Signed)
  Follow up Call-  Call back number 04/13/2019  Post procedure Call Back phone  # [phone]  Permission to leave phone message Yes  Some recent data might be hidden     Patient questions:  Do you have a fever, pain , or abdominal swelling? No. Pain Score  0 *  Have you tolerated food without any problems? Yes.    Have you been able to return to your normal activities? Yes.    Do you have any questions about your discharge instructions: Diet   No. Medications  No. Follow up visit  No.  Do you have questions or concerns about your Care? No.  Actions: * If pain score is 4 or above: 1. No action needed, pain <4.Have you developed a fever since your procedure? no  2.   Have you had an respiratory symptoms (SOB or cough) since your procedure? no  3.   Have you tested positive for COVID 19 since your procedure no  4.   Have you had any family members/close contacts diagnosed with the COVID 19 since your procedure?  no   If yes to any of these questions please route to Joylene John, RN and Alphonsa Gin, Therapist, sports.

## 2019-07-04 IMAGING — US US THYROID
1 series · 13 of 25 positions shown · non-contrast
Comparison: 03/15/2007, 04/06/2007

CLINICAL DATA: 47-year-old male with a history of thyroid nodules.

Biopsy of single right and single left nodules 04/06/2007
EXAM:
THYROID ULTRASOUND
TECHNIQUE: Ultrasound examination of the thyroid gland and adjacent soft
tissues was performed.

[Series 1: us thyroid · 0.08mm/px · 74 acquisitions, 13 frames shown]
[im 1/74]
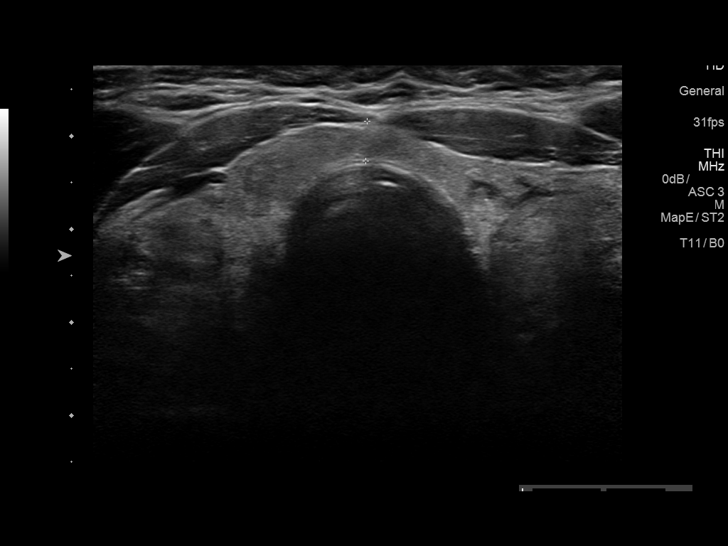
[im 7/74]
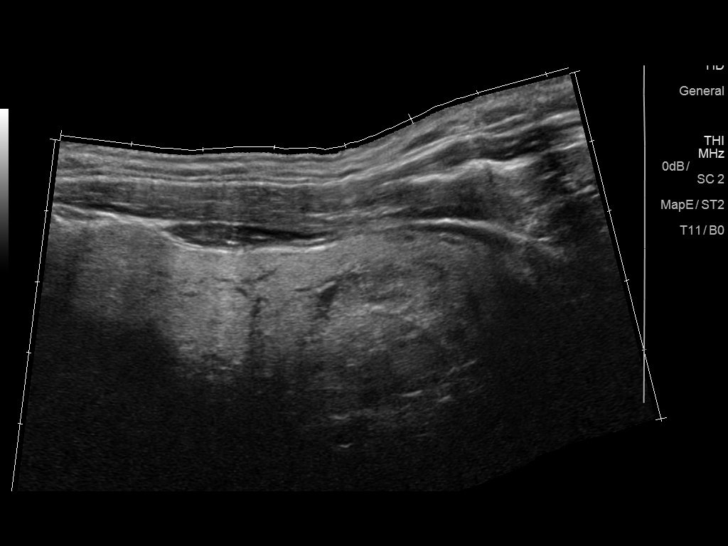
[im 13/74]
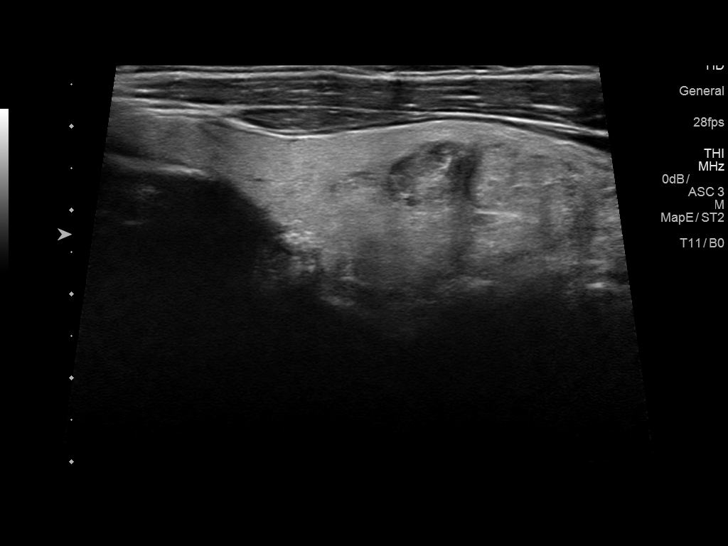
[im 19/74]
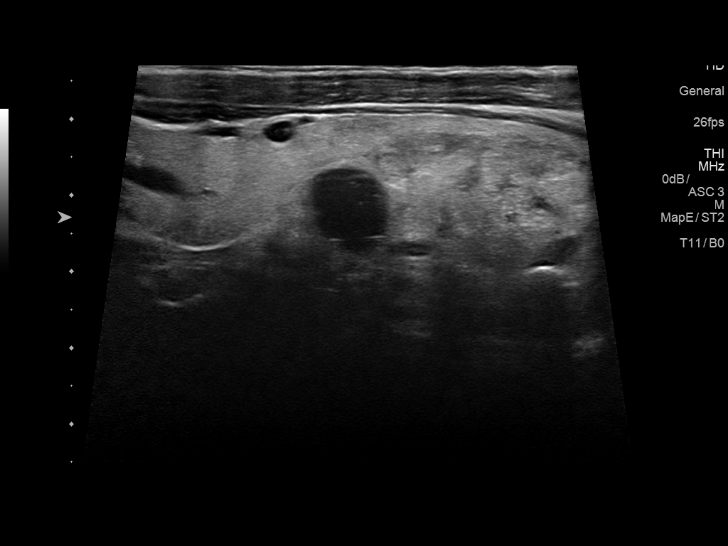
[im 25/74]
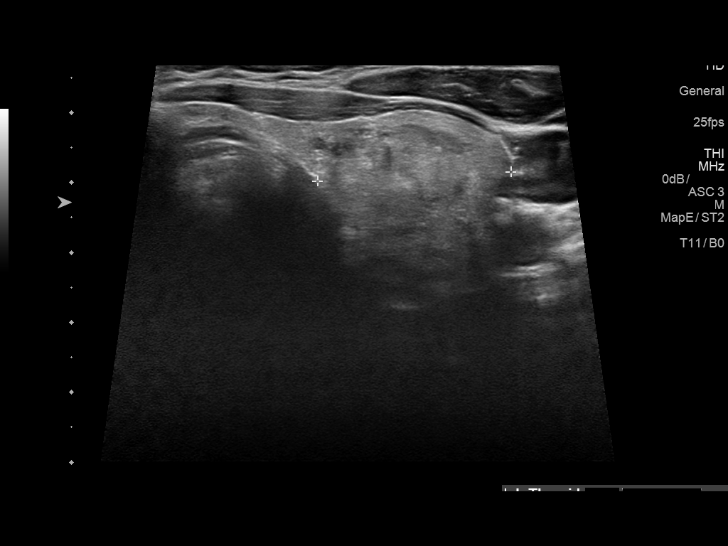
[im 31/74]
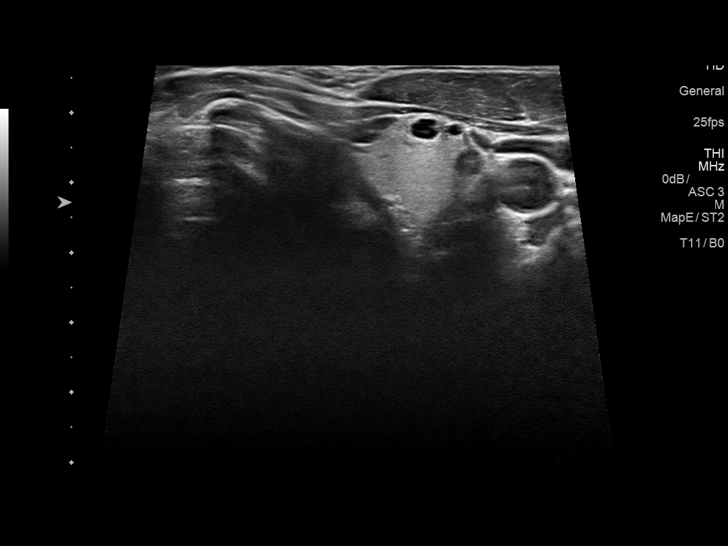
[im 37/74]
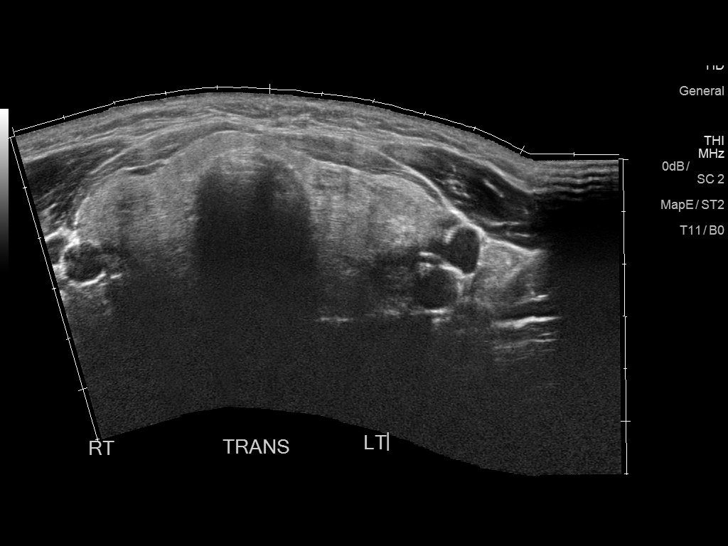
[im 43/74]
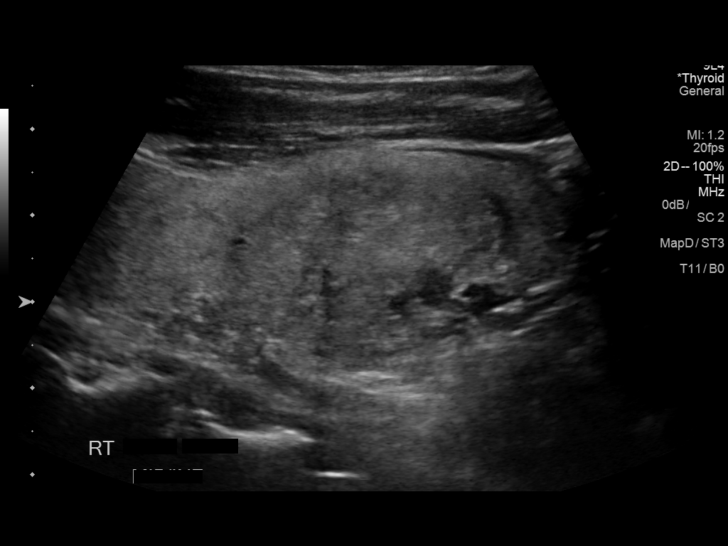
[im 49/74]
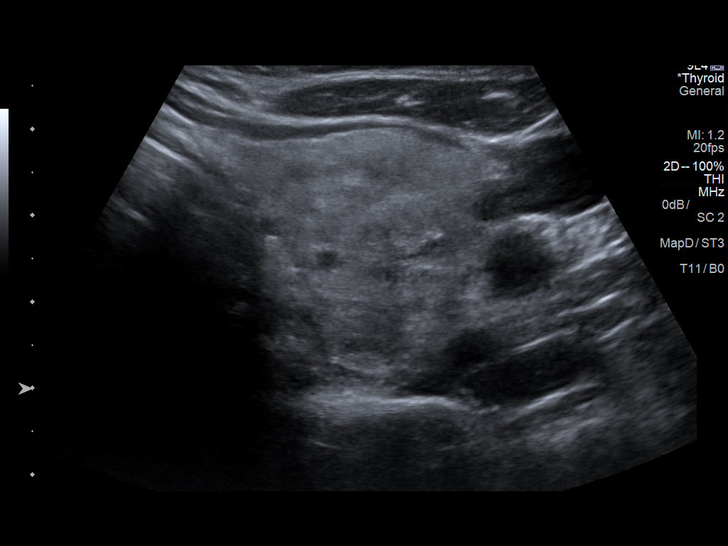
[im 55/74]
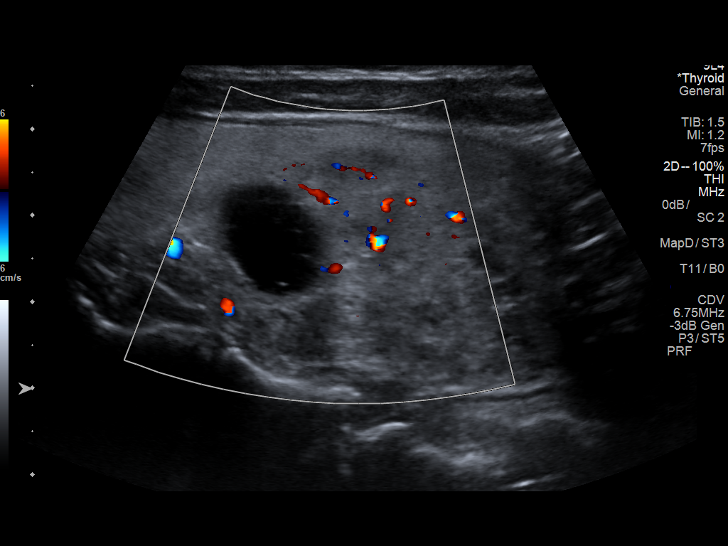
[im 61/74]
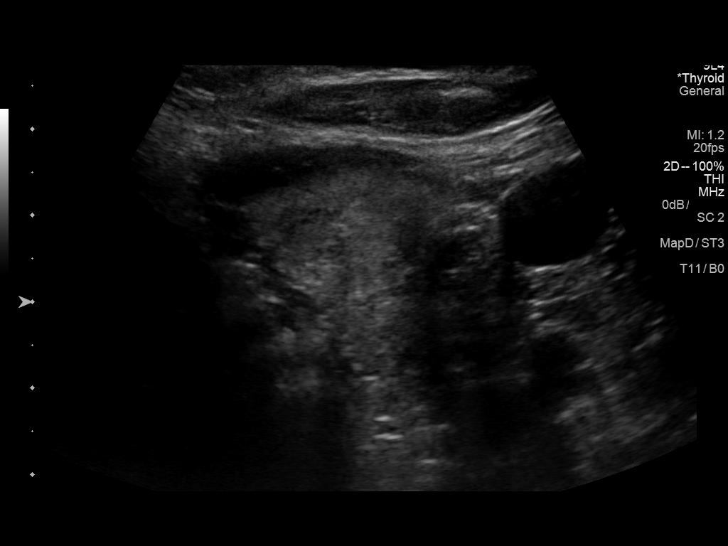
[im 67/74]
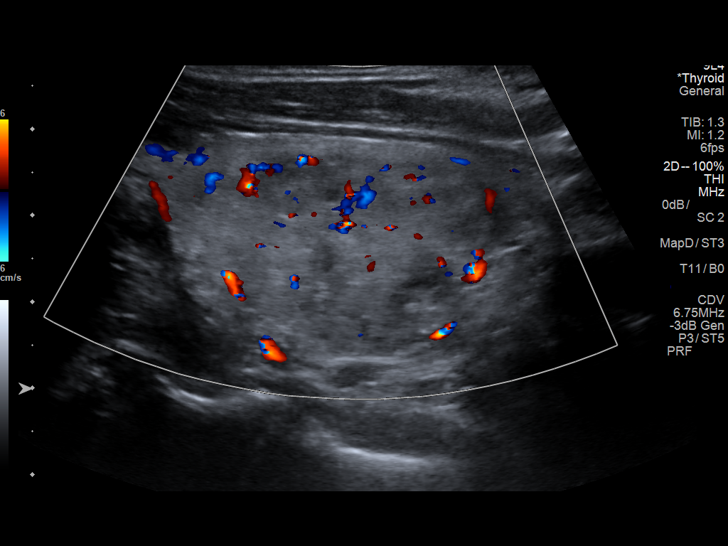
[im 74/74]
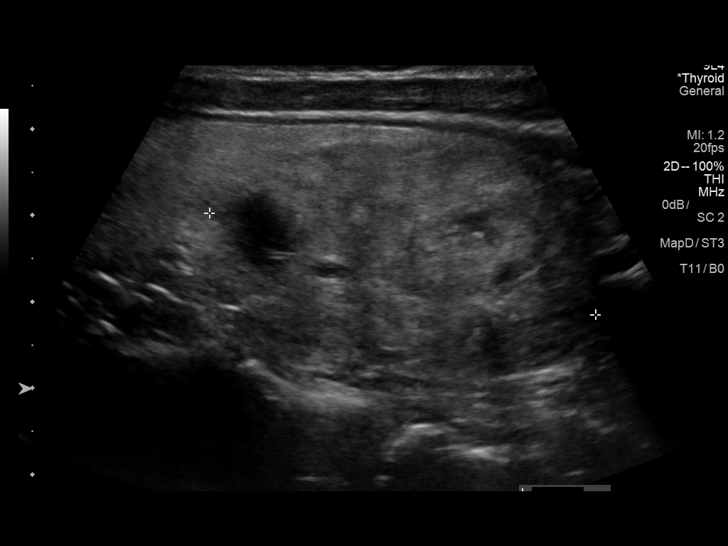

[13 of 25 positions shown; findings below may reference images not displayed]

FINDINGS: Parenchymal Echotexture: Mildly heterogenous

Isthmus: 0.4 cm

Right lobe: 6.4 cm x 3.2 cm x 2.5 cm

Left lobe: 6.8 cm x 2.7 cm x 2.8 cm

_________________________________________________________

Estimated total number of nodules >/= 1 cm: 2

Number of spongiform nodules >/=  2 cm not described below (TR1): 0

Number of mixed cystic and solid nodules >/= 1.5 cm not described
below (TR2): 0

_________________________________________________________

Nodule # 1:

Location: Right; Mid

Maximum size: 3.2 cm; Other 2 dimensions: 2.2 cm x 1.7 cm

Composition: solid/almost completely solid (2)

Echogenicity: isoechoic (1)

Shape: taller-than-wide (3)

Margins: ill-defined (0)

Echogenic foci: none (0)

ACR TI-RADS total points: 6.

ACR TI-RADS risk category: TR4 (4-6 points).

ACR TI-RADS recommendations:

Nodule is been previously biopsied

_________________________________________________________

Nodule # 2:

Location: Left; Mid

Maximum size: 4.6 cm; Other 2 dimensions: 2.7 cm x 2.0 cm

Composition: solid/almost completely solid (2)

Echogenicity: isoechoic (1)

Shape: taller-than-wide (3)

Margins: ill-defined (0)

Echogenic foci: none (0)

ACR TI-RADS total points: 6.

ACR TI-RADS risk category: TR4 (4-6 points).

ACR TI-RADS recommendations:

Nodule is been previously biopsy

_________________________________________________________

No adenopathy
IMPRESSION: Redemonstration of multinodular thyroid.

Both the left and the right thyroid nodules have been previously
biopsied. If previously benign, updated ACR recommendations are for
no further specific follow-up. If the prior biopsies were
indeterminate, repeat biopsy is warranted.

Recommendations follow those established by the new ACR TI-RADS
criteria ([HOSPITAL] 9339;[DATE]).

## 2019-09-21 ENCOUNTER — Other Ambulatory Visit: Payer: Self-pay | Admitting: Internal Medicine

## 2019-10-14 ENCOUNTER — Ambulatory Visit: Payer: BC Managed Care – PPO | Attending: Internal Medicine

## 2019-10-14 DIAGNOSIS — Z23 Encounter for immunization: Secondary | ICD-10-CM | POA: Insufficient documentation

## 2019-10-14 NOTE — Progress Notes (Signed)
   Covid-19 Vaccination Clinic  Name:  Curtis Sanders    MRN: [id number] DOB: 09/08/1970  10/14/2019  Curtis Sanders was observed post Covid-19 immunization for 15 minutes without incident. He was provided with Vaccine Information Sheet and instruction to access the V-Safe system.   Curtis Sanders was instructed to call 911 with any severe reactions post vaccine: Marland Kitchen Difficulty breathing  . Swelling of face and throat  . A fast heartbeat  . A bad rash all over body  . Dizziness and weakness   Immunizations Administered    Name Date Dose VIS Date Route   Pfizer COVID-19 Vaccine 10/14/2019  6:31 PM 0.3 mL 07/20/2019 Intramuscular   Manufacturer: [place]   Lot: [id number]   [place]: [id number]

## 2019-11-14 ENCOUNTER — Ambulatory Visit: Payer: BC Managed Care – PPO | Attending: Internal Medicine

## 2019-11-14 DIAGNOSIS — Z23 Encounter for immunization: Secondary | ICD-10-CM

## 2019-11-14 NOTE — Progress Notes (Signed)
   Covid-19 Vaccination Clinic  Name:  Curtis Sanders    MRN: [id number] DOB: 1971-04-08  11/14/2019  Curtis Sanders was observed post Covid-19 immunization for 15 minutes without incident. He was provided with Vaccine Information Sheet and instruction to access the V-Safe system.   Curtis Sanders was instructed to call 911 with any severe reactions post vaccine: Marland Kitchen Difficulty breathing  . Swelling of face and throat  . A fast heartbeat  . A bad rash all over body  . Dizziness and weakness   Immunizations Administered    Name Date Dose VIS Date Route   Pfizer COVID-19 Vaccine 11/14/2019  9:34 AM 0.3 mL 07/20/2019 Intramuscular   Manufacturer: [place]   Lot: [id number]   [place]: [id number]

## 2019-12-28 ENCOUNTER — Ambulatory Visit: Payer: BC Managed Care – PPO | Admitting: Internal Medicine

## 2020-01-18 ENCOUNTER — Encounter: Payer: Self-pay | Admitting: Internal Medicine

## 2020-01-18 ENCOUNTER — Other Ambulatory Visit: Payer: Self-pay

## 2020-01-18 ENCOUNTER — Ambulatory Visit (INDEPENDENT_AMBULATORY_CARE_PROVIDER_SITE_OTHER): Payer: BC Managed Care – PPO | Admitting: Internal Medicine

## 2020-01-18 VITALS — BP 120/76 | HR 72 | Temp 98.3°F | Ht 73.0 in | Wt 245.0 lb

## 2020-01-18 DIAGNOSIS — Z1159 Encounter for screening for other viral diseases: Secondary | ICD-10-CM | POA: Diagnosis not present

## 2020-01-18 DIAGNOSIS — E559 Vitamin D deficiency, unspecified: Secondary | ICD-10-CM

## 2020-01-18 DIAGNOSIS — R5383 Other fatigue: Secondary | ICD-10-CM

## 2020-01-18 DIAGNOSIS — R739 Hyperglycemia, unspecified: Secondary | ICD-10-CM

## 2020-01-18 DIAGNOSIS — E0590 Thyrotoxicosis, unspecified without thyrotoxic crisis or storm: Secondary | ICD-10-CM | POA: Diagnosis not present

## 2020-01-18 DIAGNOSIS — E538 Deficiency of other specified B group vitamins: Secondary | ICD-10-CM | POA: Insufficient documentation

## 2020-01-18 DIAGNOSIS — Z0000 Encounter for general adult medical examination without abnormal findings: Secondary | ICD-10-CM

## 2020-01-18 DIAGNOSIS — Z0001 Encounter for general adult medical examination with abnormal findings: Secondary | ICD-10-CM

## 2020-01-18 LAB — T3, FREE: T3, Free: 3.9 pg/mL (ref 2.3–4.2)

## 2020-01-18 LAB — T4, FREE: Free T4: 0.9 ng/dL (ref 0.60–1.60)

## 2020-01-18 LAB — FERRITIN: Ferritin: 150.2 ng/mL (ref 22.0–322.0)

## 2020-01-18 MED ORDER — TRAZODONE HCL 50 MG PO TABS: 25.0000 mg | ORAL_TABLET | Freq: Every evening | ORAL | 1 refills | Status: DC | PRN

## 2020-01-18 NOTE — Patient Instructions (Addendum)
Please take all new medication as prescribed - the trazodone for deeper sleep  Please continue all other medications as before, and refills have been done if requested.  Please have the pharmacy call with any other refills you may need.  Please continue your efforts at being more active, low cholesterol diet, and weight control.  You are otherwise up to date with prevention measures today.  Please keep your appointments with your specialists as you may have planned  Please go to the LAB at the blood drawing area for the tests to be done  You will be contacted by phone if any changes need to be made immediately.  Otherwise, you will receive a letter about your results with an explanation, but please check with MyChart first.  Please remember to sign up for MyChart if you have not done so, as this will be important to you in the future with finding out test results, communicating by private email, and scheduling acute appointments online when needed.  Please make an Appointment to return for your 1 year visit, or sooner if needed

## 2020-01-18 NOTE — Progress Notes (Addendum)
Subjective:    Patient ID: Curtis Sanders, male    DOB: Mar 29, 1971, 50 y.o.   MRN: [id number]  HPI  Here for wellness and f/u;  Overall doing ok;  Pt denies Chest pain, worsening SOB, DOE, wheezing, orthopnea, PND, worsening LE edema, palpitations, dizziness or syncope.  Pt denies neurological change such as new headache, facial or extremity weakness.  Pt denies polydipsia, polyuria, or low sugar symptoms. Pt states overall good compliance with treatment and medications, good tolerability, and has been trying to follow appropriate diet.  Pt denies worsening depressive symptoms, suicidal ideation or panic. No fever, night sweats, wt loss, loss of appetite, or other constitutional symptoms.  Pt states good ability with ADL's, has low fall risk, home safety reviewed and adequate, no other significant changes in hearing or vision, and only occasionally active with exercise.  Due for f/u vit d, iron, b12.  Denies hyper or hypo thyroid symptoms such as voice, skin or hair change.  Does c/o ongoing fatigue, but denies signficant daytime hypersomnolence, just cant seem to get tge deep sleep at night. Past Medical History:  Diagnosis Date  . Allergy   . GENITAL HERPES 05/12/2007   Qualifier: Diagnosis of  By: Marca Ancona RMA, Lucy    . GOITER, MULTINODULAR 09/27/2008   Qualifier: Diagnosis of  By: Loanne Drilling MD, Jacelyn Pi   . Hepatitis A   . HLD (hyperlipidemia) 02/27/2019  . HYPERTHYROIDISM 05/18/2007   Qualifier: Diagnosis of  By: Wynona Luna    Past Surgical History:  Procedure Laterality Date  . FOOT SURGERY     age 66 or 89   . TONSILLECTOMY      reports that he has quit smoking. He has never used smokeless tobacco. He reports previous alcohol use. He reports that he does not use drugs. family history includes Alcohol abuse in his maternal grandmother; Arthritis in his maternal grandmother; Asthma in his daughter; Cancer in his mother; Colon cancer (age of onset: 36) in his mother; Hearing loss in his  maternal grandmother and mother; Other in his mother; Stroke in his maternal grandfather. No Known Allergies No current outpatient medications on file prior to visit.   No current facility-administered medications on file prior to visit.   Review of Systems All otherwise neg per pt     Objective:   Physical Exam BP 120/76 (BP Location: Left Arm, Patient Position: Sitting, Cuff Size: Large)   Pulse 72   Temp 98.3 F (36.8 C) (Oral)   Ht 6\' 1"  (1.854 m)   Wt 245 lb (111.1 kg)   SpO2 95%   BMI 32.32 kg/m  VS noted,  Constitutional: Pt appears in NAD HENT: Head: NCAT.  Right Ear: External ear normal.  Left Ear: External ear normal.  Eyes: . Pupils are equal, round, and reactive to light. Conjunctivae and EOM are normal Nose: without d/c or deformity Neck: Neck supple. Gross normal ROM Cardiovascular: Normal rate and regular rhythm.   Pulmonary/Chest: Effort normal and breath sounds without rales or wheezing.  Abd:  Soft, NT, ND, + BS, no organomegaly Neurological: Pt is alert. At baseline orientation, motor grossly intact Skin: Skin is warm. No rashes, other new lesions, no LE edema Psychiatric: Pt behavior is normal without agitation  All otherwise neg per pt Lab Results  Component Value Date   WBC 6.0 02/27/2019   HGB 15.7 02/27/2019   HCT 47.7 02/27/2019   PLT 284.0 02/27/2019   GLUCOSE 103 (H) 02/27/2019   CHOL  190 02/27/2019   TRIG 291.0 (H) 02/27/2019   HDL 39.70 02/27/2019   LDLDIRECT 111.0 02/27/2019   LDLCALC 129 (H) 03/02/2018   ALT 14 02/27/2019   AST 13 02/27/2019   NA 141 02/27/2019   K 4.1 02/27/2019   CL 105 02/27/2019   CREATININE 0.98 02/27/2019   BUN 20 02/27/2019   CO2 27 02/27/2019   TSH 0.22 (L) 02/27/2019   PSA 0.85 02/27/2019   HGBA1C 6.0 02/27/2019      Assessment & Plan:

## 2020-01-20 ENCOUNTER — Encounter: Payer: Self-pay | Admitting: Internal Medicine

## 2020-01-20 NOTE — Assessment & Plan Note (Signed)
stable overall by history and exam, recent data reviewed with pt, and pt to continue medical treatment as before,  to f/u any worsening symptoms or concerns  

## 2020-01-20 NOTE — Assessment & Plan Note (Signed)
For f/u lab, cont oral replacement 

## 2020-01-20 NOTE — Assessment & Plan Note (Signed)
aslo fu bit d,  to f/u any worsening symptoms or concerns

## 2020-01-20 NOTE — Assessment & Plan Note (Signed)
Also for f/u iron

## 2020-01-20 NOTE — Assessment & Plan Note (Signed)
?   Need endo f/u, for f/u tft's,

## 2020-01-20 NOTE — Assessment & Plan Note (Signed)

## 2020-01-20 NOTE — Assessment & Plan Note (Addendum)
Etiology unclear, for trazodone qhs, also check home ONO per wife has oximeter at home  I spent 31 minutes in addition to time for CPX wellness examination in preparing to see the patient by review of recent labs, imaging and procedures, obtaining and reviewing separately obtained history, communicating with the patient and family or caregiver, ordering medications, tests or procedures, and documenting clinical information in the EHR including the differential Dx, treatment, and any further evaluation and other management of fatigue, hyperthyroid, hyperglycemia, vit d and b12 deficienc, increase iron level

## 2020-01-24 ENCOUNTER — Encounter: Payer: Self-pay | Admitting: Internal Medicine

## 2020-01-25 ENCOUNTER — Telehealth: Payer: BC Managed Care – PPO | Admitting: Internal Medicine

## 2020-07-20 ENCOUNTER — Other Ambulatory Visit: Payer: Self-pay | Admitting: Internal Medicine

## 2022-07-20 ENCOUNTER — Ambulatory Visit (INDEPENDENT_AMBULATORY_CARE_PROVIDER_SITE_OTHER): Payer: BC Managed Care – PPO

## 2022-07-20 ENCOUNTER — Ambulatory Visit: Payer: BC Managed Care – PPO | Admitting: Internal Medicine

## 2022-07-20 ENCOUNTER — Encounter: Payer: Self-pay | Admitting: Internal Medicine

## 2022-07-20 VITALS — BP 122/74 | HR 70 | Temp 98.2°F | Ht 73.0 in | Wt 244.0 lb

## 2022-07-20 DIAGNOSIS — J0100 Acute maxillary sinusitis, unspecified: Secondary | ICD-10-CM | POA: Diagnosis not present

## 2022-07-20 DIAGNOSIS — R052 Subacute cough: Secondary | ICD-10-CM | POA: Insufficient documentation

## 2022-07-20 MED ORDER — HYDROCOD POLI-CHLORPHE POLI ER 10-8 MG/5ML PO SUER: 5.0000 mL | Freq: Two times a day (BID) | ORAL | 0 refills | Status: AC | PRN

## 2022-07-20 MED ORDER — AMOXICILLIN-POT CLAVULANATE 875-125 MG PO TABS: 1.0000 | ORAL_TABLET | Freq: Two times a day (BID) | ORAL | 0 refills | Status: AC

## 2022-07-20 NOTE — Patient Instructions (Signed)

## 2022-07-20 NOTE — Progress Notes (Unsigned)
Subjective:  Patient ID: Curtis Sanders, male    DOB: 06-08-1971  Age: 51 y.o. MRN: [id number]  CC: URI, Sinusitis, and Cough   HPI Curtis Sanders presents for f/up -  He complains of a 2-week history of nonproductive cough, runny nose, thick yellow nasal phlegm, and PND.  Outpatient Medications Prior to Visit  Medication Sig Dispense Refill   traZODone (DESYREL) 50 MG tablet TAKE 1/2 TO 1 TABLET(25 TO 50 MG) BY MOUTH AT BEDTIME AS NEEDED FOR SLEEP 90 tablet 1   No facility-administered medications prior to visit.    ROS Review of Systems  Constitutional:  Negative for chills, diaphoresis, fatigue and fever.  HENT:  Positive for congestion, postnasal drip, rhinorrhea and sore throat. Negative for facial swelling, sinus pressure, sinus pain and trouble swallowing.   Respiratory:  Positive for cough. Negative for chest tightness, shortness of breath and wheezing.   Cardiovascular:  Negative for chest pain, palpitations and leg swelling.  Gastrointestinal:  Negative for abdominal pain and nausea.  Genitourinary: Negative.   Musculoskeletal: Negative.   Skin: Negative.   Neurological: Negative.   Hematological:  Negative for adenopathy. Does not bruise/bleed easily.  Psychiatric/Behavioral: Negative.      Objective:  BP 122/74 (BP Location: Right Arm, Patient Position: Sitting, Cuff Size: Large)   Pulse 70   Temp 98.2 F (36.8 C) (Oral)   Ht '6\' 1"'$  (1.854 m)   Wt 244 lb (110.7 kg)   SpO2 96%   BMI 32.19 kg/m   BP Readings from Last 3 Encounters:  07/20/22 122/74  01/18/20 120/76  04/13/19 110/73    Wt Readings from Last 3 Encounters:  07/20/22 244 lb (110.7 kg)  01/18/20 245 lb (111.1 kg)  04/13/19 234 lb (106.1 kg)    Physical Exam Vitals reviewed.  Constitutional:      Appearance: He is not ill-appearing.  HENT:     Nose: Rhinorrhea present. Rhinorrhea is purulent.     Right Nostril: No epistaxis.     Left Nostril: No epistaxis.     Mouth/Throat:      Mouth: Mucous membranes are moist.     Pharynx: No posterior oropharyngeal erythema.  Eyes:     Conjunctiva/sclera: Conjunctivae normal.  Cardiovascular:     Rate and Rhythm: Normal rate and regular rhythm.     Heart sounds: No murmur heard. Pulmonary:     Effort: Pulmonary effort is normal.     Breath sounds: No stridor. No wheezing, rhonchi or rales.  Abdominal:     General: Abdomen is flat.     Palpations: There is no mass.     Tenderness: There is no abdominal tenderness. There is no guarding.     Hernia: No hernia is present.  Musculoskeletal:        General: Normal range of motion.     Cervical back: Neck supple.  Lymphadenopathy:     Cervical: No cervical adenopathy.  Skin:    General: Skin is warm.     Findings: No rash.  Neurological:     General: No focal deficit present.     Mental Status: He is alert. Mental status is at baseline.  Psychiatric:        Mood and Affect: Mood normal.     Lab Results  Component Value Date   WBC 6.0 02/27/2019   HGB 15.7 02/27/2019   HCT 47.7 02/27/2019   PLT 284.0 02/27/2019   GLUCOSE 103 (H) 02/27/2019   CHOL 190 02/27/2019  TRIG 291.0 (H) 02/27/2019   HDL 39.70 02/27/2019   LDLDIRECT 111.0 02/27/2019   LDLCALC 129 (H) 03/02/2018   ALT 14 02/27/2019   AST 13 02/27/2019   NA 141 02/27/2019   K 4.1 02/27/2019   CL 105 02/27/2019   CREATININE 0.98 02/27/2019   BUN 20 02/27/2019   CO2 27 02/27/2019   TSH 0.22 (L) 02/27/2019   PSA 0.85 02/27/2019   HGBA1C 6.0 02/27/2019    DG Chest 2 View  Result Date: 07/20/2022 CLINICAL DATA:  Cough. EXAM: CHEST - 2 VIEW COMPARISON:  None Available. FINDINGS: The heart size and mediastinal contours are within normal limits. Both lungs are clear. The visualized skeletal structures are unremarkable. IMPRESSION: No active cardiopulmonary disease. Electronically Signed   By: Marijo Conception M.D.   On: 07/20/2022 15:41     Assessment & Plan:   Laquincy was seen today for uri,  sinusitis and cough.  Diagnoses and all orders for this visit:  Acute non-recurrent maxillary sinusitis -     amoxicillin-clavulanate (AUGMENTIN) 875-125 MG tablet; Take 1 tablet by mouth 2 (two) times daily for 10 days.  Subacute cough- Chest x-ray is negative for mass or infiltrate.  I think he has acute sinusitis.  Will treat with Augmentin and offer symptom relief.   -     amoxicillin-clavulanate (AUGMENTIN) 875-125 MG tablet; Take 1 tablet by mouth 2 (two) times daily for 10 days. -     chlorpheniramine-HYDROcodone (TUSSIONEX) 10-8 MG/5ML; Take 5 mLs by mouth every 12 (twelve) hours as needed for up to 10 days for cough. -     DG Chest 2 View; Future   I am having Earna Coder start on amoxicillin-clavulanate and chlorpheniramine-HYDROcodone. I am also having him maintain his traZODone.  Meds ordered this encounter  Medications   amoxicillin-clavulanate (AUGMENTIN) 875-125 MG tablet    Sig: Take 1 tablet by mouth 2 (two) times daily for 10 days.    Dispense:  20 tablet    Refill:  0   chlorpheniramine-HYDROcodone (TUSSIONEX) 10-8 MG/5ML    Sig: Take 5 mLs by mouth every 12 (twelve) hours as needed for up to 10 days for cough.    Dispense:  70 mL    Refill:  0     Follow-up: Return in about 3 weeks (around 08/10/2022).  Scarlette Calico, MD

## 2022-07-30 ENCOUNTER — Encounter: Payer: Self-pay | Admitting: Internal Medicine

## 2022-08-03 ENCOUNTER — Encounter: Payer: Self-pay | Admitting: Internal Medicine

## 2022-08-06 ENCOUNTER — Ambulatory Visit: Payer: BC Managed Care – PPO | Admitting: Internal Medicine

## 2022-08-06 ENCOUNTER — Encounter: Payer: Self-pay | Admitting: Internal Medicine

## 2022-08-06 VITALS — BP 124/78 | HR 74 | Temp 98.4°F | Ht 73.0 in | Wt 249.0 lb

## 2022-08-06 DIAGNOSIS — E538 Deficiency of other specified B group vitamins: Secondary | ICD-10-CM | POA: Diagnosis not present

## 2022-08-06 DIAGNOSIS — J301 Allergic rhinitis due to pollen: Secondary | ICD-10-CM | POA: Diagnosis not present

## 2022-08-06 DIAGNOSIS — E785 Hyperlipidemia, unspecified: Secondary | ICD-10-CM | POA: Diagnosis not present

## 2022-08-06 DIAGNOSIS — R739 Hyperglycemia, unspecified: Secondary | ICD-10-CM

## 2022-08-06 DIAGNOSIS — R5383 Other fatigue: Secondary | ICD-10-CM

## 2022-08-06 DIAGNOSIS — E0590 Thyrotoxicosis, unspecified without thyrotoxic crisis or storm: Secondary | ICD-10-CM

## 2022-08-06 DIAGNOSIS — R9431 Abnormal electrocardiogram [ECG] [EKG]: Secondary | ICD-10-CM

## 2022-08-06 LAB — CBC WITH DIFFERENTIAL/PLATELET
Basophils Absolute: 0 10*3/uL (ref 0.0–0.1)
Basophils Relative: 0.4 % (ref 0.0–3.0)
Eosinophils Absolute: 0.2 10*3/uL (ref 0.0–0.7)
Eosinophils Relative: 3.8 % (ref 0.0–5.0)
HCT: 46.7 % (ref 39.0–52.0)
Hemoglobin: 15.3 g/dL (ref 13.0–17.0)
Lymphocytes Relative: 29.1 % (ref 12.0–46.0)
Lymphs Abs: 1.4 10*3/uL (ref 0.7–4.0)
MCHC: 32.8 g/dL (ref 30.0–36.0)
MCV: 80.2 fl (ref 78.0–100.0)
Monocytes Absolute: 0.4 10*3/uL (ref 0.1–1.0)
Monocytes Relative: 7.1 % (ref 3.0–12.0)
Neutro Abs: 2.9 10*3/uL (ref 1.4–7.7)
Neutrophils Relative %: 59.6 % (ref 43.0–77.0)
Platelets: 288 10*3/uL (ref 150.0–400.0)
RBC: 5.83 Mil/uL — ABNORMAL HIGH (ref 4.22–5.81)
RDW: 14.1 % (ref 11.5–15.5)
WBC: 4.9 10*3/uL (ref 4.0–10.5)

## 2022-08-06 LAB — LIPID PANEL
Cholesterol: 185 mg/dL (ref 0–200)
HDL: 42.2 mg/dL (ref >39.00)
NonHDL: 143.07
Total CHOL/HDL Ratio: 4
Triglycerides: 234 mg/dL — ABNORMAL HIGH (ref 0.0–149.0)
VLDL: 46.8 mg/dL — ABNORMAL HIGH (ref 0.0–40.0)

## 2022-08-06 LAB — IBC + FERRITIN
Ferritin: 183.4 ng/mL (ref 22.0–322.0)
Iron: 131 ug/dL (ref 42–165)
Saturation Ratios: 45.6 % (ref 20.0–50.0)
TIBC: 287 ug/dL (ref 250.0–450.0)
Transferrin: 205 mg/dL — ABNORMAL LOW (ref 212.0–360.0)

## 2022-08-06 LAB — BASIC METABOLIC PANEL
BUN: 19 mg/dL (ref 6–23)
CO2: 30 mEq/L (ref 19–32)
Calcium: 9.1 mg/dL (ref 8.4–10.5)
Chloride: 105 mEq/L (ref 96–112)
Creatinine, Ser: 0.92 mg/dL (ref 0.40–1.50)
GFR: 94.75 mL/min (ref >60.00)
Glucose, Bld: 97 mg/dL (ref 70–99)
Potassium: 3.9 mEq/L (ref 3.5–5.1)
Sodium: 142 mEq/L (ref 135–145)

## 2022-08-06 LAB — HEMOGLOBIN A1C: Hgb A1c MFr Bld: 6 % (ref 4.6–6.5)

## 2022-08-06 LAB — HEPATIC FUNCTION PANEL
ALT: 23 U/L (ref 0–53)
AST: 18 U/L (ref 0–37)
Albumin: 4.2 g/dL (ref 3.5–5.2)
Alkaline Phosphatase: 71 U/L (ref 39–117)
Bilirubin, Direct: 0.1 mg/dL (ref 0.0–0.3)
Total Bilirubin: 0.6 mg/dL (ref 0.2–1.2)
Total Protein: 6.7 g/dL (ref 6.0–8.3)

## 2022-08-06 LAB — FOLATE: Folate: 15.1 ng/mL (ref >5.9)

## 2022-08-06 LAB — TROPONIN I (HIGH SENSITIVITY): High Sens Troponin I: 4 ng/L (ref 2–17)

## 2022-08-06 LAB — LDL CHOLESTEROL, DIRECT: Direct LDL: 112 mg/dL

## 2022-08-06 LAB — VITAMIN B12: Vitamin B-12: 260 pg/mL (ref 211–911)

## 2022-08-06 MED ORDER — LEVOCETIRIZINE DIHYDROCHLORIDE 5 MG PO TABS: 5.0000 mg | ORAL_TABLET | Freq: Every evening | ORAL | 1 refills | Status: DC

## 2022-08-06 MED ORDER — AZELASTINE HCL 0.1 % NA SOLN: 2.0000 | Freq: Two times a day (BID) | NASAL | 1 refills | Status: DC

## 2022-08-06 NOTE — Progress Notes (Signed)
Subjective:  Patient ID: Curtis Sanders, male    DOB: 04-06-1971  Age: 51 y.o. MRN: [id number]  CC: URI   HPI Curtis Sanders presents for f/up -  His cough has improved but he continues to complain of a postnasal drip.  He has a history of hayfever but is not taking anything for the symptoms.  He complains of persistent fatigue but denies chest pain, shortness of breath, diaphoresis, or edema.  Outpatient Medications Prior to Visit  Medication Sig Dispense Refill   traZODone (DESYREL) 50 MG tablet TAKE 1/2 TO 1 TABLET(25 TO 50 MG) BY MOUTH AT BEDTIME AS NEEDED FOR SLEEP 90 tablet 1   No facility-administered medications prior to visit.    ROS Review of Systems  Constitutional:  Positive for fatigue. Negative for appetite change, chills, diaphoresis, fever and unexpected weight change.  HENT:  Positive for postnasal drip. Negative for congestion, facial swelling, nosebleeds, rhinorrhea, sinus pressure, sore throat and trouble swallowing.   Respiratory:  Positive for cough. Negative for chest tightness, shortness of breath and wheezing.   Cardiovascular:  Negative for chest pain, palpitations and leg swelling.  Gastrointestinal:  Negative for abdominal pain, diarrhea and nausea.  Genitourinary: Negative.  Negative for difficulty urinating and dysuria.  Musculoskeletal: Negative.   Skin: Negative.   Neurological: Negative.  Negative for dizziness and weakness.  Hematological: Negative.  Negative for adenopathy. Does not bruise/bleed easily.  Psychiatric/Behavioral: Negative.      Objective:  BP 124/78 (BP Location: Left Arm, Patient Position: Sitting, Cuff Size: Large)   Pulse 74   Temp 98.4 F (36.9 C) (Oral)   Ht '6\' 1"'$  (1.854 m)   Wt 249 lb (112.9 kg)   SpO2 95%   BMI 32.85 kg/m   BP Readings from Last 3 Encounters:  08/06/22 124/78  07/20/22 122/74  01/18/20 120/76    Wt Readings from Last 3 Encounters:  08/06/22 249 lb (112.9 kg)  07/20/22 244 lb (110.7  kg)  01/18/20 245 lb (111.1 kg)    Physical Exam HENT:     Nose: Mucosal edema present. No congestion or rhinorrhea.     Right Nostril: No epistaxis.     Right Turbinates: Pale. Not enlarged or swollen.     Left Turbinates: Pale. Not enlarged or swollen.     Right Sinus: No maxillary sinus tenderness or frontal sinus tenderness.     Left Sinus: No maxillary sinus tenderness or frontal sinus tenderness.  Neck:     Thyroid: No thyroid mass, thyromegaly or thyroid tenderness.  Cardiovascular:     Comments: EKG-  NSR, 62 bpm Inferior infarct pattern ?anterior infarct pattern No old EKG's to compare  Musculoskeletal:     Cervical back: Neck supple.     Right lower leg: No edema.     Left lower leg: No edema.  Lymphadenopathy:     Cervical: No cervical adenopathy.     Lab Results  Component Value Date   WBC 4.9 08/06/2022   HGB 15.3 08/06/2022   HCT 46.7 08/06/2022   PLT 288.0 08/06/2022   GLUCOSE 97 08/06/2022   CHOL 185 08/06/2022   TRIG 234.0 (H) 08/06/2022   HDL 42.20 08/06/2022   LDLDIRECT 112.0 08/06/2022   LDLCALC 129 (H) 03/02/2018   ALT 23 08/06/2022   AST 18 08/06/2022   NA 142 08/06/2022   K 3.9 08/06/2022   CL 105 08/06/2022   CREATININE 0.92 08/06/2022   BUN 19 08/06/2022   CO2 30 08/06/2022  TSH 0.12 (L) 08/06/2022   PSA 0.85 02/27/2019   HGBA1C 6.0 08/06/2022    DG Chest 2 View  Result Date: 07/20/2022 CLINICAL DATA:  Cough. EXAM: CHEST - 2 VIEW COMPARISON:  None Available. FINDINGS: The heart size and mediastinal contours are within normal limits. Both lungs are clear. The visualized skeletal structures are unremarkable. IMPRESSION: No active cardiopulmonary disease. Electronically Signed   By: Marijo Conception M.D.   On: 07/20/2022 15:41     Assessment & Plan:   Curtis Sanders was seen today for uri.  Diagnoses and all orders for this visit:  Seasonal allergic rhinitis due to pollen -     levocetirizine (XYZAL) 5 MG tablet; Take 1 tablet (5 mg  total) by mouth every evening. -     azelastine (ASTELIN) 0.1 % nasal spray; Place 2 sprays into both nostrils 2 (two) times daily. Use in each nostril as directed  Hyperglycemia- He is prediabetic. -     Basic metabolic panel; Future -     Hemoglobin A1c; Future -     Hemoglobin A1c -     Basic metabolic panel  Hyperlipidemia, unspecified hyperlipidemia type- His ASCVD risk score is 4.7%.  Statin is not indicated. -     Lipid panel; Future -     Hepatic function panel; Future -     Hepatic function panel -     Lipid panel  Increased storage iron- Iron and ferritin are normal. -     IBC + Ferritin; Future -     Hepatic function panel; Future -     Hepatic function panel -     IBC + Ferritin  Other fatigue- Labs are negative for secondary causes but his EKG is abnormal.  Will evaluate for infarct. -     CBC with Differential/Platelet; Future -     Thyroid Panel With TSH; Future -     EKG 12-Lead -     Thyroid Panel With TSH -     CBC with Differential/Platelet -     MYOCARDIAL PERFUSION IMAGING; Future -     Cardiac Stress Test: Informed Consent Details: Physician/Practitioner Attestation; Transcribe to consent form and obtain patient signature; Future  B12 deficiency- B12 and folate are normal. -     Folate; Future -     CBC with Differential/Platelet; Future -     Vitamin B12; Future -     Vitamin B12 -     CBC with Differential/Platelet -     Folate  Abnormal finding on EKG -     Troponin I (High Sensitivity); Future -     Troponin I (High Sensitivity) -     MYOCARDIAL PERFUSION IMAGING; Future -     Cardiac Stress Test: Informed Consent Details: Physician/Practitioner Attestation; Transcribe to consent form and obtain patient signature; Future  Abnormal electrocardiogram -     MYOCARDIAL PERFUSION IMAGING; Future -     Cardiac Stress Test: Informed Consent Details: Physician/Practitioner Attestation; Transcribe to consent form and obtain patient signature;  Future  Subclinical hyperthyroidism  Other orders -     LDL cholesterol, direct   I am having Curtis Sanders start on levocetirizine and azelastine. I am also having him maintain his traZODone.  Meds ordered this encounter  Medications   levocetirizine (XYZAL) 5 MG tablet    Sig: Take 1 tablet (5 mg total) by mouth every evening.    Dispense:  90 tablet    Refill:  1   azelastine (  ASTELIN) 0.1 % nasal spray    Sig: Place 2 sprays into both nostrils 2 (two) times daily. Use in each nostril as directed    Dispense:  90 mL    Refill:  1     Follow-up: No follow-ups on file.  Scarlette Calico, MD

## 2022-08-07 DIAGNOSIS — E0590 Thyrotoxicosis, unspecified without thyrotoxic crisis or storm: Secondary | ICD-10-CM | POA: Insufficient documentation

## 2022-08-07 LAB — THYROID PANEL WITH TSH
Free Thyroxine Index: 2.5 (ref 1.4–3.8)
T3 Uptake: 36 % — ABNORMAL HIGH (ref 22–35)
T4, Total: 7 ug/dL (ref 4.9–10.5)
TSH: 0.12 mIU/L — ABNORMAL LOW (ref 0.40–4.50)

## 2022-08-12 ENCOUNTER — Ambulatory Visit: Payer: BC Managed Care – PPO | Admitting: Internal Medicine

## 2022-08-13 ENCOUNTER — Encounter: Payer: Self-pay | Admitting: Internal Medicine

## 2022-08-13 ENCOUNTER — Encounter: Payer: BC Managed Care – PPO | Admitting: Internal Medicine

## 2022-08-13 NOTE — Telephone Encounter (Signed)
No need for paxlovid unless + covid testing or onset symptoms in a household with covid + person

## 2022-08-16 MED ORDER — NIRMATRELVIR/RITONAVIR (PAXLOVID)TABLET: 3.0000 | ORAL_TABLET | Freq: Two times a day (BID) | ORAL | 0 refills | Status: AC

## 2022-08-16 MED ORDER — HYDROCODONE BIT-HOMATROP MBR 5-1.5 MG/5ML PO SOLN: 5.0000 mL | Freq: Four times a day (QID) | ORAL | 0 refills | Status: DC | PRN

## 2022-08-17 MED ORDER — HYDROCODONE BIT-HOMATROP MBR 5-1.5 MG/5ML PO SOLN: 5.0000 mL | Freq: Four times a day (QID) | ORAL | 0 refills | Status: AC | PRN

## 2022-08-17 NOTE — Telephone Encounter (Signed)
Ok this is done 

## 2022-08-17 NOTE — Addendum Note (Signed)
Addended by: Biagio Borg on: 08/17/2022 04:42 PM   Modules accepted: Orders

## 2022-08-31 ENCOUNTER — Ambulatory Visit (INDEPENDENT_AMBULATORY_CARE_PROVIDER_SITE_OTHER): Payer: BC Managed Care – PPO | Admitting: Internal Medicine

## 2022-08-31 VITALS — BP 136/82 | HR 84 | Temp 98.2°F | Ht 73.0 in | Wt 239.0 lb

## 2022-08-31 DIAGNOSIS — J301 Allergic rhinitis due to pollen: Secondary | ICD-10-CM

## 2022-08-31 DIAGNOSIS — Z23 Encounter for immunization: Secondary | ICD-10-CM | POA: Diagnosis not present

## 2022-08-31 DIAGNOSIS — Z125 Encounter for screening for malignant neoplasm of prostate: Secondary | ICD-10-CM

## 2022-08-31 DIAGNOSIS — R739 Hyperglycemia, unspecified: Secondary | ICD-10-CM

## 2022-08-31 DIAGNOSIS — Z0001 Encounter for general adult medical examination with abnormal findings: Secondary | ICD-10-CM | POA: Diagnosis not present

## 2022-08-31 DIAGNOSIS — Z1159 Encounter for screening for other viral diseases: Secondary | ICD-10-CM

## 2022-08-31 DIAGNOSIS — E785 Hyperlipidemia, unspecified: Secondary | ICD-10-CM

## 2022-08-31 DIAGNOSIS — E538 Deficiency of other specified B group vitamins: Secondary | ICD-10-CM

## 2022-08-31 DIAGNOSIS — R9431 Abnormal electrocardiogram [ECG] [EKG]: Secondary | ICD-10-CM

## 2022-08-31 DIAGNOSIS — E559 Vitamin D deficiency, unspecified: Secondary | ICD-10-CM | POA: Diagnosis not present

## 2022-08-31 DIAGNOSIS — E0590 Thyrotoxicosis, unspecified without thyrotoxic crisis or storm: Secondary | ICD-10-CM

## 2022-08-31 LAB — PSA: PSA: 0.98 ng/mL (ref 0.10–4.00)

## 2022-08-31 NOTE — Progress Notes (Unsigned)
Patient ID: Curtis Sanders, male   DOB: [birth year], 52 y.o.   MRN: [id number]         Chief Complaint:: wellness exam and allergies, abnormal ecg, abnormal tsh, prediabetes, hld       HPI:  Curtis Sanders is a 52 y.o. male here for wellness exam; for shingrix at pharmacy, declines covid booster, ok for flu shot and tdap,, o/w up to date                        Also works teaching at [employer].  Pt denies chest pain, increased sob or doe, wheezing, orthopnea, PND, increased LE swelling, palpitations, dizziness or syncope.   Pt denies polydipsia, polyuria, or new focal neuro s/s.    Pt denies fever, wt loss, night sweats, loss of appetite, or other constitutional symptoms   Denies hyper or hypo thyroid symptoms such as voice, skin or hair change.  Lost 10 lbs recently with better diet.  But also has hx of depressed TSH, asks for new endo referral.  Does have several wks ongoing nasal allergy symptoms with clearish congestion, itch and sneezing, without fever, pain, ST, cough, swelling or wheezing.  Also mentions he had ECG recently at [facility] with menition of can't r/o MI.  Pt not sure he wants to start statin, trying to work on diet.  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 He has never used smokeless tobacco. He reports that he does not currently use alcohol. He reports that he does not use drugs. family history includes Alcohol abuse in his maternal grandmother; Arthritis in his maternal grandmother; Asthma in his daughter; Cancer in his mother; Colon cancer (age of onset: 61) in his mother; Hearing loss in his maternal grandmother and mother; Other in his mother; Stroke in his maternal grandfather. No Known Allergies Current Outpatient Medications on File Prior to Visit  Medication Sig Dispense Refill   azelastine (ASTELIN) 0.1 % nasal spray Place 2 sprays into both nostrils 2 (two) times daily. Use in each nostril as directed 90 mL 1   levocetirizine (XYZAL) 5 MG tablet Take 1 tablet (5 mg total) by mouth every evening. 90 tablet 1   No current facility-administered medications on file prior to visit.        ROS:  All others reviewed and negative.  Objective        PE:  BP 136/82 (BP Location: Left Arm, Patient Position: Sitting, Cuff Size: Large)   Pulse 84   Temp 98.2 F (36.8 C) (Oral)   Ht '6\' 1"'$  (1.854 m)   Wt 239 lb (108.4 kg)   SpO2 92%   BMI 31.53 kg/m                 Constitutional: Pt appears in NAD               HENT: Head: NCAT.                Right Ear: External ear normal.                 Left Ear: External ear normal. Bilat tm's with mild erythema.  Max sinus areas non tender.  Pharynx with mild erythema, no exudate               Eyes: . Pupils are equal, round, and reactive to light. Conjunctivae and EOM are normal               Nose: without d/c or deformity               Neck: Neck supple. Gross normal ROM               Cardiovascular: Normal rate and regular rhythm.                  Pulmonary/Chest: Effort normal and breath sounds without rales or wheezing.                Abd:  Soft, NT, ND, + BS, no organomegaly               Neurological: Pt is alert. At baseline orientation, motor grossly intact               Skin: Skin is warm. No rashes, no other new lesions, LE edema - none               Psychiatric: Pt behavior is normal without agitation   Micro: none  Cardiac tracings I have personally interpreted today:  none  Pertinent Radiological findings (summarize): none   Lab Results  Component Value Date   WBC 4.9 08/06/2022   HGB 15.3 08/06/2022   HCT 46.7 08/06/2022   PLT 288.0 08/06/2022   GLUCOSE 97 08/06/2022   CHOL 185 08/06/2022   TRIG 234.0 (H) 08/06/2022   HDL 42.20 08/06/2022   LDLDIRECT 112.0 08/06/2022   LDLCALC 129 (H) 03/02/2018   ALT 23 08/06/2022   AST 18 08/06/2022   NA 142 08/06/2022   K 3.9 08/06/2022   CL 105 08/06/2022   CREATININE 0.92 08/06/2022   BUN 19 08/06/2022   CO2 30 08/06/2022   TSH 0.12 (L) 08/06/2022   PSA 0.98 08/31/2022   HGBA1C 6.0 08/06/2022   Assessment/Plan:  Curtis Sanders is a 52 y.o. White or Caucasian [1] male with  has a past medical history of Allergy, GENITAL HERPES (05/12/2007), GOITER, MULTINODULAR (09/27/2008), Hepatitis A, HLD (hyperlipidemia) (02/27/2019), and HYPERTHYROIDISM (05/18/2007).  Encounter for well adult exam with abnormal findings Age and sex appropriate education and counseling updated with regular exercise and diet Referrals for preventative services - none needed Immunizations addressed - declines covid booster, for shingrix at pharmacy, but for flu shot and tdap Smoking counseling  - none needed Evidence for  depression or other mood disorder - none significant Most recent labs reviewed. I have personally reviewed and have noted: 1) the patient's medical and social history 2) The patient's current medications and supplements 3) The patient's height, weight, and BMI  have been recorded in the chart   Hyperthyroidism Lab Results  Component Value Date   TSH 0.12 (L) 08/06/2022   Pt now for endo referral  Allergic rhinitis Mild to mod, for nasacort asd,  to f/u any worsening symptoms or concerns  B12 deficiency Lab Results  Component Value Date   VITAMINB12 260 08/06/2022   Low, to start oral replacement - b12 1000 mcg qd   HLD (hyperlipidemia) Lab Results  Component Value Date   LDLCALC 129 (H) 03/02/2018   Uncontrolled, for lower chol diet, declines statin for now    Hyperglycemia Lab Results  Component Value Date   HGBA1C 6.0 08/06/2022   Stable, pt to continue current medical treatment  - diet,wt control   Vitamin D deficiency Last vitamin D Lab Results  Component Value Date   VD25OH 22.00 (L) 02/27/2019   Low, to start oral replacement   Abnormal electrocardiogram Ecg reviewed 08-06-22, can't r/o underlying cardiac dz, for stress testing, echo  Followup: Return in about 6 months (around 03/01/2023).  Cathlean Cower, MD 09/02/2022 8:15 PM [facility]

## 2022-08-31 NOTE — Patient Instructions (Signed)
Ok to try the OTC Nasacort in addition to the Xyzal antihistamine treatment  You had the flu shot today, and the Tdap tetanus shot today  Please have your Shingrix (shingles) shots done at your local pharmacy - [facility] for Shot #2  Please continue all other medications as before, and refills have been done if requested.  Please have the pharmacy call with any other refills you may need.  Please continue your efforts at being more active, low cholesterol diet, and weight control.  You are otherwise up to date with prevention measures today.  Please keep your appointments with your specialists as you may have planned  You will be contacted regarding the referral for: Endocrinology, stress test and echocardiogram  Please go to the LAB at the blood drawing area for the tests to be done - just the few tests today  You will be contacted by phone if any changes need to be made immediately.  Otherwise, you will receive a letter about your results with an explanation, but please check with MyChart first.  Please remember to sign up for MyChart if you have not done so, as this will be important to you in the future with finding out test results, communicating by private email, and scheduling acute appointments online when needed.  Please make an Appointment to return in 6 months, or sooner if needed

## 2022-09-01 LAB — HEPATITIS C ANTIBODY: Hepatitis C Ab: NONREACTIVE

## 2022-09-02 ENCOUNTER — Encounter: Payer: Self-pay | Admitting: Internal Medicine

## 2022-09-02 NOTE — Assessment & Plan Note (Signed)
Last vitamin D Lab Results  Component Value Date   VD25OH 22.00 (L) 02/27/2019   Low, to start oral replacement

## 2022-09-02 NOTE — Assessment & Plan Note (Addendum)
Age and sex appropriate education and counseling updated with regular exercise and diet Referrals for preventative services - none needed Immunizations addressed - declines covid booster, for shingrix at pharmacy, but for flu shot and tdap Smoking counseling  - none needed Evidence for depression or other mood disorder - none significant Most recent labs reviewed. I have personally reviewed and have noted: 1) the patient's medical and social history 2) The patient's current medications and supplements 3) The patient's height, weight, and BMI have been recorded in the chart

## 2022-09-02 NOTE — Assessment & Plan Note (Signed)
Ecg reviewed 08-06-22, can't r/o underlying cardiac dz, for stress testing, echo

## 2022-09-02 NOTE — Assessment & Plan Note (Addendum)
Lab Results  Component Value Date   VITAMINB12 260 08/06/2022   Low, to start oral replacement - b12 1000 mcg qd

## 2022-09-02 NOTE — Assessment & Plan Note (Signed)
Lab Results  Component Value Date   LDLCALC 129 (H) 03/02/2018   Uncontrolled, for lower chol diet, declines statin for now

## 2022-09-02 NOTE — Assessment & Plan Note (Signed)
Lab Results  Component Value Date   TSH 0.12 (L) 08/06/2022   Pt now for endo referral

## 2022-09-02 NOTE — Assessment & Plan Note (Signed)
Lab Results  Component Value Date   HGBA1C 6.0 08/06/2022   Stable, pt to continue current medical treatment  - diet,wt control

## 2022-09-02 NOTE — Assessment & Plan Note (Signed)
Mild to mod, for nasacort asd,  to f/u any worsening symptoms or concerns 

## 2022-09-06 ENCOUNTER — Telehealth (HOSPITAL_COMMUNITY): Payer: Self-pay

## 2022-09-06 NOTE — Telephone Encounter (Signed)
Detailed instructions left on the patient's answering machine. Asked to call back with any questions. S.Shain Pauwels EMTP/CCT 

## 2022-09-07 ENCOUNTER — Ambulatory Visit (HOSPITAL_COMMUNITY): Payer: BC Managed Care – PPO | Attending: Cardiology

## 2022-09-07 DIAGNOSIS — R9431 Abnormal electrocardiogram [ECG] [EKG]: Secondary | ICD-10-CM | POA: Diagnosis present

## 2022-09-07 LAB — MYOCARDIAL PERFUSION IMAGING
Angina Index: 0
Duke Treadmill Score: 9
Estimated workload: 10.1
Exercise duration (min): 9 min
LV dias vol: 101 mL (ref 62–150)
LV sys vol: 47 mL
MPHR: 167 {beats}/min
Nuc Stress EF: 54 %
Peak HR: 141 {beats}/min
Percent HR: 85 %
RPE: 18
Rest HR: 51 {beats}/min
Rest Nuclear Isotope Dose: 10.2 mCi
SDS: 0
SRS: 0
SSS: 0
ST Depression (mm): 0 mm
Stress Nuclear Isotope Dose: 32.2 mCi
TID: 1.05

## 2022-09-07 MED ORDER — TECHNETIUM TC 99M TETROFOSMIN IV KIT
10.2000 | PACK | Freq: Once | INTRAVENOUS | Status: AC | PRN
  Administered 2022-09-07: 10.2 via INTRAVENOUS

## 2022-09-07 MED ORDER — TECHNETIUM TC 99M TETROFOSMIN IV KIT
32.2000 | PACK | Freq: Once | INTRAVENOUS | Status: AC | PRN
  Administered 2022-09-07: 32.2 via INTRAVENOUS

## 2022-09-23 ENCOUNTER — Ambulatory Visit (HOSPITAL_COMMUNITY): Payer: BC Managed Care – PPO | Attending: Internal Medicine

## 2022-09-23 DIAGNOSIS — R9431 Abnormal electrocardiogram [ECG] [EKG]: Secondary | ICD-10-CM

## 2022-09-24 LAB — ECHOCARDIOGRAM COMPLETE
Area-P 1/2: 3.92 cm2
S' Lateral: 3.5 cm

## 2022-10-05 ENCOUNTER — Encounter: Payer: Self-pay | Admitting: Internal Medicine

## 2022-10-05 ENCOUNTER — Ambulatory Visit: Payer: BC Managed Care – PPO | Admitting: Internal Medicine

## 2022-10-05 VITALS — BP 124/70 | HR 83 | Ht 73.0 in | Wt 239.0 lb

## 2022-10-05 DIAGNOSIS — E0590 Thyrotoxicosis, unspecified without thyrotoxic crisis or storm: Secondary | ICD-10-CM

## 2022-10-05 DIAGNOSIS — E042 Nontoxic multinodular goiter: Secondary | ICD-10-CM

## 2022-10-05 LAB — T4, FREE: Free T4: 0.83 ng/dL (ref 0.60–1.60)

## 2022-10-05 LAB — TSH: TSH: 0.23 u[IU]/mL — ABNORMAL LOW (ref 0.35–5.50)

## 2022-10-05 NOTE — Progress Notes (Unsigned)
Name: Curtis Sanders  MRN/ DOB: [id number], June 15, 1971    Age/ Sex: 52 y.o., male    PCP: Biagio Borg, MD   Reason for Endocrinology Evaluation: Hyperthyroidism     Date of Initial Endocrinology Evaluation: 10/06/2022     HPI: [name] is a 52 y.o. male with a past medical history of MNG. The patient presented for initial endocrinology clinic visit on 10/06/2022 for consultative assistance with his Hyperthyroidism.   Patient has been diagnosed with multinodular goiter since 2008.  He is s/p benign FNA of the right and left nodules in 2008.   Patient has been noted with low TSH since 2008 with a nadir of 0.12 u IU/mL in December 2023.  Of note, the patient has always had normal T4 and T3.  He had a thyroid uptake and scan in 2008 at with a 24-hour I-131 uptake of 25% and hyperfunctioning nodules.  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 He has never used smokeless tobacco. He reports that he does not currently use alcohol. He reports that he does not use drugs. Family History: family history includes Alcohol abuse in his maternal grandmother;  Arthritis in his maternal grandmother; Asthma in his daughter; Cancer in his mother; Colon cancer (age of onset: 9) in his mother; Hearing loss in his maternal grandmother and mother; Other in his mother; Stroke in his maternal grandfather.   HOME MEDICATIONS: Allergies as of 10/05/2022   No Known Allergies      Medication List        Accurate as of October 05, 2022 11:59 PM. If you have any questions, ask your nurse or doctor.          azelastine 0.1 % nasal spray Commonly known as: ASTELIN Place 2 sprays into both nostrils 2 (two) times daily. Use in each nostril as directed   levocetirizine 5 MG tablet Commonly known as: XYZAL Take 1 tablet (5 mg total) by mouth every evening.          REVIEW OF SYSTEMS: A comprehensive ROS was conducted with the patient and is negative except as per HPI    OBJECTIVE:  VS: BP 124/70 (BP Location: Left Arm, Patient Position: Sitting, Cuff Size: Large)   Pulse 83   Ht '6\' 1"'$  (1.854 m)   Wt 239 lb (108.4 kg)   SpO2 96%   BMI 31.53 kg/m    Wt Readings from Last 3 Encounters:  10/05/22  239 lb (108.4 kg)  09/07/22 239 lb (108.4 kg)  08/31/22 239 lb (108.4 kg)     EXAM: General: Pt appears well and is in NAD  Eyes: External eye exam normal without stare, lid lag or exophthalmos.  EOM intact.  PERRL.  Neck: General: Supple without adenopathy. Thyroid: Thyroid size normal.  nodules appreciated. No thyroid bruit.  Lungs: Clear with good BS bilat with no rales, rhonchi, or wheezes  Heart: Auscultation: RRR.  Abdomen: Normoactive bowel sounds, soft, nontender, without masses or organomegaly palpable  Extremities:  BL LE: No pretibial edema normal ROM and strength.  Mental Status: Judgment, insight: Intact Orientation: Oriented to time, place, and person Mood and affect: No depression, anxiety, or agitation     DATA REVIEWED:     Latest Reference Range & Units 10/05/22 12:37  TSH 0.35 - 5.50 uIU/mL 0.23 (L)   Triiodothyronine (T3) 76 - 181 ng/dL 127  T4,Free(Direct) 0.60 - 1.60 ng/dL 0.83  (L): Data is abnormally low Thyroid ultrasound 03/13/2018  Estimated total number of nodules >/= 1 cm: 2   Number of spongiform nodules >/=  2 cm not described below (TR1): 0   Number of mixed cystic and solid nodules >/= 1.5 cm not described below ([place]): 0   _________________________________________________________   Nodule # 1:   Location: Right; Mid   Maximum size: 3.2 cm; Other 2 dimensions: 2.2 cm x 1.7 cm   Composition: solid/almost completely solid (2)   Echogenicity: isoechoic (1)   Shape: taller-than-wide (3)   Margins: ill-defined (0)   Echogenic foci: none (0)   ACR TI-RADS total points: 6.   ACR TI-RADS risk category: TR4 (4-6 points).   ACR TI-RADS recommendations:   Nodule is been previously biopsied   _________________________________________________________   Nodule # 2:   Location: Left; Mid   Maximum size: 4.6 cm; Other 2 dimensions: 2.7 cm x 2.0 cm   Composition: solid/almost completely solid (2)   Echogenicity: isoechoic (1)   Shape: taller-than-wide (3)   Margins: ill-defined (0)   Echogenic foci: none (0)   ACR TI-RADS total points: 6.   ACR TI-RADS risk category: TR4 (4-6 points).   ACR TI-RADS recommendations:   Nodule is been previously biopsy   _________________________________________________________   No adenopathy   IMPRESSION: Redemonstration of multinodular thyroid.   Both the left and the right thyroid nodules have been previously biopsied. If previously benign, updated ACR recommendations are for no further specific follow-up. If the prior biopsies were indeterminate, repeat biopsy is warranted.    FNA 04/06/2007  INTERPRETATION(S):   THYROID, LEFT, FINE NEEDLE ASPIRATION (SMEARS, THIN PREPARATION   AND CELL BLOCK): FINDINGS CONSISTENT WITH NONNEOPLASTIC GOITER.    FNA 04/06/2007 INTERPRETATION(S):   THYROID, RIGHT, FINE  NEEDLE ASPIRATION (SMEARS, THIN   PREPARATION): FINDINGS CONSISTENT WITH NONNEOPLASTIC GOITER.   PLEASE SEE COMMENT.   Old records , labs and images have been reviewed.   ASSESSMENT/PLAN/RECOMMENDATIONS:   Hyperthyroidism:  -Patient with fatigue -Uptake and scan in 2008 and 2009 where within normal range -Given that the nodules were hypofunctioning in 2008, I suspect subclinical Graves' disease -Given fatigue and chronicity of low TSH, I would offer methimazole to the patient  Medications : Methimazole 5 mg, 1 tablet 4 days a week  2.  Multinodular goiter:  -He is s/p benign FNA of the left and the right nodules in 2008 -These nodules were hypofunctioning on uptake and scan in 2008, I was unable to look up the images as they were  not available -No local neck symptoms -Will repeat thyroid ultrasound to confirm stability    Follow-up in 6 months   Signed electronically by: Mack Guise, MD  [facility]  [facility] [address] [place], [place] [address] Phone: [phone] FAX: [phone]   CC: Biagio Borg, [place] [address] Phone: [phone] Fax: [phone]   Return to Endocrinology clinic as below: Future Appointments  Date Time Provider [place]  04/15/2023  9:50 AM Lawsyn Heiler, Melanie Crazier, MD [id number] None

## 2022-10-06 ENCOUNTER — Encounter: Payer: Self-pay | Admitting: Internal Medicine

## 2022-10-07 LAB — T3: T3, Total: 127 ng/dL (ref 76–181)

## 2022-10-07 LAB — TRAB (TSH RECEPTOR BINDING ANTIBODY): TRAB: <1 IU/L (ref <=2.00)

## 2022-10-07 MED ORDER — METHIMAZOLE 5 MG PO TABS: 5.0000 mg | ORAL_TABLET | ORAL | 1 refills | Status: DC

## 2022-12-08 ENCOUNTER — Other Ambulatory Visit (INDEPENDENT_AMBULATORY_CARE_PROVIDER_SITE_OTHER): Payer: BC Managed Care – PPO

## 2022-12-08 DIAGNOSIS — E0590 Thyrotoxicosis, unspecified without thyrotoxic crisis or storm: Secondary | ICD-10-CM

## 2022-12-08 LAB — T4, FREE: Free T4: 0.88 ng/dL (ref 0.60–1.60)

## 2022-12-08 LAB — TSH: TSH: 0.41 u[IU]/mL (ref 0.35–5.50)

## 2022-12-09 LAB — T3: T3, Total: 104 ng/dL (ref 76–181)

## 2023-02-25 ENCOUNTER — Encounter: Payer: Self-pay | Admitting: Internal Medicine

## 2023-04-05 ENCOUNTER — Other Ambulatory Visit: Payer: Self-pay | Admitting: Internal Medicine

## 2023-04-14 NOTE — Progress Notes (Unsigned)
Name: Curtis Sanders  MRN/ DOB: [id number], April 01, 1971    Age/ Sex: 52 y.o., male    PCP: Corwin Levins, MD   Reason for Endocrinology Evaluation: Hyperthyroidism     Date of Initial Endocrinology Evaluation: 10/05/2022    HPI: Curtis Sanders is a 52 y.o. male with a past medical history of MNG. The patient presented for initial endocrinology clinic visit on 10/05/2022 for consultative assistance with his Hyperthyroidism.   Patient has been diagnosed with multinodular goiter since 2008.  He is s/p benign FNA of the right and left nodules in 2008.   Patient has been noted with low TSH since 2008 with a nadir of 0.12 u IU/mL in December 2023.  Of note, the patient has always had normal T4 and T3.  He had a thyroid uptake and scan in 2008 at with a 24-hour I-131 uptake of 25% and hyperfunctioning nodules.  He had a repeat uptake and scan in 2009 with 24-hour I-131 uptake of 8%    No XRT  No history of osteoporosis No history of cardiac arrhythmia  Mother with goiter    He was started on a small dose of methimazole 09/2022 due to fatigue, with a TSH of 0.23 u IU/mL, normal T4 and T3  SUBJECTIVE:    Today (04/15/23):  Curtis Sanders is here for a follow up on hyperthyroidism.   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 He has never used smokeless tobacco. He reports that he does not currently use alcohol. He reports that he does not use drugs. Family History: family history includes Alcohol abuse in his maternal grandmother; Arthritis in his maternal grandmother; Asthma in his daughter; Cancer in his mother; Colon cancer (age of onset: 60) in his mother; Hearing loss in his maternal grandmother and mother; Other in his mother; Stroke in his maternal grandfather.   HOME MEDICATIONS: Allergies as of 04/15/2023   No Known Allergies      Medication List        Accurate as of April 15, 2023  9:57 AM. If you have any questions, ask your nurse or doctor.          STOP taking these medications    azelastine 0.1 % nasal spray Commonly known as: ASTELIN Stopped by: Johnney [employer]       TAKE these medications    levocetirizine 5 MG tablet Commonly known as: XYZAL Take 1 tablet (5 mg total) by mouth every evening.   methimazole 5 MG tablet  Commonly known as: TAPAZOLE Take 1 tablet (5 mg total) by mouth as directed. 1 tablet 4 days a week          REVIEW OF SYSTEMS: A comprehensive ROS was conducted with the patient and is negative except as per HPI    OBJECTIVE:  VS: BP 130/80 (BP Location: Left Arm, Patient Position: Sitting, Cuff Size: Large)   Pulse 68   Ht 6\' 1"  (1.854 m)   Wt 228 lb (103.4 kg)   SpO2 95%   BMI 30.08 kg/m    Wt Readings from Last 3 Encounters:  04/15/23 228 lb (103.4 kg)  10/05/22 239 lb (108.4 kg)  09/07/22 239 lb (108.4 kg)     EXAM: General: Pt appears well and is in NAD  Neck: General: Supple without adenopathy. Thyroid: Right thyroid asymmetry noted     Lungs: Clear with good BS bilat   Heart: Auscultation: RRR.  Abdomen: soft, nontender  Extremities:  BL LE: trace pretibial edema   Mental Status:  Judgment, insight: Intact Orientation: Oriented to time, place, and person Mood and affect: No depression, anxiety, or agitation     DATA REVIEWED:      Latest Reference Range & Units 10/05/22 12:37  TRAB <=2.00 IU/L <1.00   Thyroid ultrasound 03/13/2018  Estimated total number of nodules >/= 1 cm: 2   Number of spongiform nodules >/=  2 cm not described below (TR1): 0   Number of mixed cystic and solid nodules >/= 1.5 cm not described below (TR2): 0   _________________________________________________________   Nodule # 1:   Location: Right; Mid   Maximum size: 3.2 cm; Other 2 dimensions: 2.2 cm x 1.7 cm   Composition: solid/almost completely solid (2)   Echogenicity: isoechoic (1)   Shape: taller-than-wide (3)   Margins: ill-defined (0)   Echogenic foci: none (0)   ACR TI-RADS total points: 6.   ACR TI-RADS risk category: TR4 (4-6 points).   ACR TI-RADS recommendations:   Nodule is been previously biopsied   _________________________________________________________   Nodule # 2:   Location: Left; Mid   Maximum size: 4.6 cm; Other 2 dimensions: 2.7 cm x 2.0 cm   Composition: solid/almost completely solid (2)   Echogenicity: isoechoic (1)   Shape: taller-than-wide (3)   Margins: ill-defined (0)   Echogenic foci: none (0)   ACR TI-RADS total points: 6.   ACR TI-RADS risk category: TR4 (4-6 points).   ACR TI-RADS recommendations:   Nodule is been previously biopsy   _________________________________________________________   No adenopathy   IMPRESSION: Redemonstration of multinodular thyroid.   Both the left and the right thyroid nodules have been previously biopsied. If previously benign, updated ACR recommendations are for no further specific follow-up. If the prior biopsies were indeterminate, repeat biopsy is warranted.    FNA 04/06/2007  INTERPRETATION(S):   THYROID, LEFT, FINE NEEDLE ASPIRATION (SMEARS, THIN PREPARATION   AND  CELL BLOCK): FINDINGS CONSISTENT WITH NONNEOPLASTIC GOITER.    FNA 04/06/2007 INTERPRETATION(S):   THYROID, RIGHT, FINE NEEDLE ASPIRATION (SMEARS, THIN   PREPARATION): FINDINGS CONSISTENT WITH NONNEOPLASTIC GOITER.   Old records , labs and images have been reviewed.   ASSESSMENT/PLAN/RECOMMENDATIONS:   Hyperthyroidism:  -Patient with fatigue, This has not improved with starting methimazole -Uptake and scan in 2008 and 2009 where within normal range -Given that the nodules were hypofunctioning in 2008 - TRAB  undetectable   Medications : Methimazole 5 mg, 1 tablet 4 days a week  2.  Multinodular goiter:  -  He is s/p benign FNA of the left and the right nodules in 2008 -These nodules were hypofunctioning on uptake and scan in 2008, I was unable to look up the images as they were not available -No local neck symptoms -An ultrasound of the thyroid was ordered 09/2022 but this has not been done yet,  a new order was placed today   Follow-up in 6 months   Signed electronically by: Lyndle Herrlich, MD  [facility]ology  [facility] [address] [place] [place], [place] [address] Phone: [phone] FAX: [phone]   CC: Corwin Levins, MD [address] [place] [address] Phone: [phone] Fax: [phone]   Return to Endocrinology clinic as below: No future appointments.

## 2023-04-15 ENCOUNTER — Encounter: Payer: Self-pay | Admitting: Internal Medicine

## 2023-04-15 ENCOUNTER — Ambulatory Visit: Payer: BC Managed Care – PPO | Admitting: Internal Medicine

## 2023-04-15 VITALS — BP 130/80 | HR 68 | Ht 73.0 in | Wt 228.0 lb

## 2023-04-15 DIAGNOSIS — E0590 Thyrotoxicosis, unspecified without thyrotoxic crisis or storm: Secondary | ICD-10-CM

## 2023-04-15 DIAGNOSIS — E042 Nontoxic multinodular goiter: Secondary | ICD-10-CM

## 2023-04-15 LAB — T4, FREE: Free T4: 1.15 ng/dL (ref 0.60–1.60)

## 2023-04-15 LAB — T3, FREE: T3, Free: 3.7 pg/mL (ref 2.3–4.2)

## 2023-04-15 LAB — TSH: TSH: 0.36 u[IU]/mL (ref 0.35–5.50)

## 2023-04-18 MED ORDER — METHIMAZOLE 5 MG PO TABS: 5.0000 mg | ORAL_TABLET | ORAL | 3 refills | Status: DC

## 2023-04-20 ENCOUNTER — Ambulatory Visit
Admission: RE | Admit: 2023-04-20 | Discharge: 2023-04-20 | Disposition: A | Discharge status: Home / Self Care | Payer: BC Managed Care – PPO | Source: Ambulatory Visit | Attending: Internal Medicine | Admitting: Internal Medicine

## 2023-04-20 DIAGNOSIS — E042 Nontoxic multinodular goiter: Secondary | ICD-10-CM

## 2023-04-27 ENCOUNTER — Encounter: Payer: Self-pay | Admitting: Internal Medicine

## 2023-04-27 ENCOUNTER — Telehealth: Payer: Self-pay

## 2023-04-27 DIAGNOSIS — E042 Nontoxic multinodular goiter: Secondary | ICD-10-CM

## 2023-04-27 DIAGNOSIS — E0590 Thyrotoxicosis, unspecified without thyrotoxic crisis or storm: Secondary | ICD-10-CM

## 2023-04-27 NOTE — Telephone Encounter (Signed)
Attempted to call the pt twice 04/27/2023 at 1545 , calls went to voice mail    I have reviewed his thyroid ultrasound from this year in comparison to 2019 it appears that nodule #1 right mid has shown 20% increase in in 2 dimensions 2.80x 2.70x 3.44 cm(previously 2.17 x 1.66 x 3.2)  Left nodule had a maximum diameter of 4.6 cm in 2019 and now currently at 5.7 cm   I would recommend proceeding with thyroid uptake and scan to see if these nodules are hot or cold  The following step will be FNA of any cold or warm nodules   I will send the message to the patient as we have not been able to talk to each other over the phone through the MyChart

## 2023-04-27 NOTE — Telephone Encounter (Signed)
Patient left vm that he is returning your call

## 2023-05-09 ENCOUNTER — Encounter (HOSPITAL_COMMUNITY)
Admission: RE | Admit: 2023-05-09 | Discharge: 2023-05-09 | Disposition: A | Discharge status: Home / Self Care | Payer: BC Managed Care – PPO | Source: Ambulatory Visit | Attending: Internal Medicine | Admitting: Internal Medicine

## 2023-05-09 ENCOUNTER — Encounter (HOSPITAL_COMMUNITY): Payer: BC Managed Care – PPO

## 2023-05-09 DIAGNOSIS — E042 Nontoxic multinodular goiter: Secondary | ICD-10-CM | POA: Insufficient documentation

## 2023-05-09 MED ORDER — SODIUM IODIDE I-123 7.4 MBQ CAPS
423.0000 | ORAL_CAPSULE | Freq: Once | ORAL | Status: AC
  Administered 2023-05-09: 423 via ORAL

## 2023-05-10 ENCOUNTER — Encounter (HOSPITAL_COMMUNITY)
Admission: RE | Admit: 2023-05-10 | Discharge: 2023-05-10 | Disposition: A | Discharge status: Home / Self Care | Payer: BC Managed Care – PPO | Source: Ambulatory Visit | Attending: Internal Medicine | Admitting: Internal Medicine

## 2023-05-10 ENCOUNTER — Other Ambulatory Visit (HOSPITAL_COMMUNITY): Payer: BC Managed Care – PPO

## 2023-05-10 DIAGNOSIS — E042 Nontoxic multinodular goiter: Secondary | ICD-10-CM | POA: Diagnosis present

## 2023-05-23 ENCOUNTER — Other Ambulatory Visit (HOSPITAL_COMMUNITY): Payer: BC Managed Care – PPO

## 2023-05-24 ENCOUNTER — Other Ambulatory Visit (HOSPITAL_COMMUNITY): Payer: BC Managed Care – PPO

## 2023-06-29 ENCOUNTER — Other Ambulatory Visit (HOSPITAL_COMMUNITY)
Admission: RE | Admit: 2023-06-29 | Discharge: 2023-06-29 | Disposition: A | Discharge status: Home / Self Care | Payer: BC Managed Care – PPO | Source: Ambulatory Visit | Attending: Internal Medicine | Admitting: Internal Medicine

## 2023-06-29 ENCOUNTER — Ambulatory Visit
Admission: RE | Admit: 2023-06-29 | Discharge: 2023-06-29 | Disposition: A | Discharge status: Home / Self Care | Payer: BC Managed Care – PPO | Source: Ambulatory Visit | Attending: Internal Medicine | Admitting: Internal Medicine

## 2023-06-29 DIAGNOSIS — E042 Nontoxic multinodular goiter: Secondary | ICD-10-CM

## 2023-06-29 DIAGNOSIS — E041 Nontoxic single thyroid nodule: Secondary | ICD-10-CM | POA: Insufficient documentation

## 2023-06-30 LAB — CYTOLOGY - NON PAP

## 2023-07-27 ENCOUNTER — Other Ambulatory Visit: Payer: BC Managed Care – PPO

## 2023-10-12 NOTE — Progress Notes (Signed)
 Name: Curtis Sanders  MRN/ DOB: [id number], [birth year]    Age/ Sex: 53 y.o., male    PCP: Corwin Levins, MD   Reason for Endocrinology Evaluation: Hyperthyroidism     Date of Initial Endocrinology Evaluation: 10/05/2022    HPI: [name] is a 53 y.o. male with a past medical history of MNG. The patient presented for initial endocrinology clinic visit on 10/05/2022 for consultative assistance with his Hyperthyroidism.   Patient has been diagnosed with multinodular goiter since 2008.  He is s/p benign FNA of the right and left nodules in 2008.   Patient has been noted with low TSH since 2008 with a nadir of 0.12 u IU/mL in December 2023.  Of note, the patient has always had normal T4 and T3.  He had a thyroid uptake and scan in 2008 at with a 24-hour I-131 uptake of 25% and hyperfunctioning nodules.  He had a repeat uptake and scan in 2009 with 24-hour I-131 uptake of 8%    No XRT  No history of osteoporosis No history of cardiac arrhythmia  Mother with goiter    He was started on a small dose of methimazole 09/2022 due to fatigue, with a TSH of 0.23 u IU/mL, normal T4 and T3  SUBJECTIVE:    Today (10/13/23):  Curtis Sanders is here for a follow up on hyperthyroidism.   Weight fluctuates  Denies local neck swelling , denies dysphagia  Denies palpitations  Denies tremors  Denies constipation or diarrhea  No change in energy level   Methimazole 5 mg, 4 days a week ( sat, sun , tues and thur)     HISTORY:  Past Medical History:  Past Medical History:  Diagnosis Date   Allergy    GENITAL HERPES 05/12/2007   Qualifier: Diagnosis of  By: Tyrone Apple, Lucy     GOITER, MULTINODULAR 09/27/2008   Qualifier: Diagnosis of  By: Everardo All MD, Sean A    Hepatitis A    HLD (hyperlipidemia) 02/27/2019   HYPERTHYROIDISM 05/18/2007   Qualifier: Diagnosis of  By: Nena Jordan    Past Surgical History:  Past Surgical History:  Procedure Laterality Date   FOOT  SURGERY     age 10 or 44    TONSILLECTOMY      Social History:  reports that he has quit smoking. He has never used smokeless tobacco. He reports that he does not currently use alcohol. He reports that he does not use drugs. Family History: family history includes Alcohol abuse in his maternal grandmother; Arthritis in his maternal grandmother; Asthma in his daughter; Cancer in his mother; Colon cancer (age of onset: 10) in his mother; Hearing loss in his maternal grandmother and mother; Other in his mother; Stroke in his maternal grandfather.   HOME MEDICATIONS: Allergies as of 10/13/2023   No Known Allergies      Medication List        Accurate as of October 13, 2023  8:44 AM. If you have any questions, ask your nurse or doctor.          fluconazole 200 MG tablet Commonly known as: DIFLUCAN Take by mouth.   levocetirizine 5 MG tablet Commonly known as: XYZAL Take 1 tablet (5 mg total) by mouth every evening.   methimazole 5 MG tablet Commonly known as: TAPAZOLE Take 1 tablet (5 mg total) by mouth as directed. 1 tablet 4 days a week  REVIEW OF SYSTEMS: A comprehensive ROS was conducted with the patient and is negative except as per HPI    OBJECTIVE:  VS: BP 128/82 (BP Location: Left Arm, Patient Position: Sitting, Cuff Size: Small)   Pulse 67   Ht 6\' 1"  (1.854 m)   Wt 231 lb (104.8 kg)   SpO2 98%   BMI 30.48 kg/m    Wt Readings from Last 3 Encounters:  10/13/23 231 lb (104.8 kg)  04/15/23 228 lb (103.4 kg)  10/05/22 239 lb (108.4 kg)     EXAM: General: Pt appears well and is in NAD  Neck: General: Supple without adenopathy. Thyroid: Right thyroid asymmetry noted     Lungs: Clear with good BS bilat   Heart: Auscultation: RRR.  Abdomen: soft, nontender  Extremities:  BL LE: trace pretibial edema   Mental Status: Judgment, insight: Intact Orientation: Oriented to time, place, and person Mood and affect: No depression, anxiety, or agitation      DATA REVIEWED:     Lab Results  Component Value Date   HGBA1C 5.7 (A) 10/13/2023   HGBA1C 6.0 08/06/2022   HGBA1C 6.0 02/27/2019     Latest Reference Range & Units 10/05/22 12:37  TRAB <=2.00 IU/L <1.00   Thyroid ultrasound 04/20/2023 Estimated total number of nodules >/= 1 cm: 4   Number of spongiform nodules >/=  2 cm not described below (TR1): 0   Number of mixed cystic and solid nodules >/= 1.5 cm not described below (TR2): 0   _________________________________________________________   Nodule labeled 1 in the mid right thyroid, 3.4 cm and relatively unchanged. Nodule has undergone prior biopsy 04/06/2007.   Nodule labeled 2, inferior right thyroid, 2.2 cm x 2.0 cm x 1.8 cm. Nodule has TR 3 characteristics and meets criteria for surveillance.   Nodule labeled 3, inferior right thyroid, 1.6 cm x 1.6 cm x 1.4 cm. Nodule has TR 3 characteristics and meets criteria for surveillance.   Nodule labeled 4, mid left thyroid, 5.7 cm. Nodule has undergone prior biopsy 04/06/2007.   No adenopathy   IMPRESSION: Similar appearance of multinodular thyroid.   Right inferior thyroid nodules (labeled 2, 3) both meet criteria for surveillance, as designated by the newly established ACR TI-RADS criteria. Surveillance ultrasound study recommended to be performed annually up to 5 years.   Nodules labeled 1 and 4 in the mid right and mid left thyroid have undergone prior biopsy. Correlation with prior results may be useful.     FNA Left Nodule 06/29/2023    Clinical History: Nodule labeled 4, mid left thyroid, 5.7 cm. Nodule has undergone prior biopsy 04/06/2007. Specimen Submitted:  A. THYROID, LT MID, FINE NEEDLE ASPIRATION   FINAL MICROSCOPIC DIAGNOSIS: - Benign follicular nodule (Bethesda category II)      FNA 04/06/2007  INTERPRETATION(S):   THYROID, LEFT, FINE NEEDLE ASPIRATION (SMEARS, THIN PREPARATION   AND CELL BLOCK): FINDINGS CONSISTENT WITH  NONNEOPLASTIC GOITER.    FNA 04/06/2007 INTERPRETATION(S):   THYROID, RIGHT, FINE NEEDLE ASPIRATION (SMEARS, THIN   PREPARATION): FINDINGS CONSISTENT WITH NONNEOPLASTIC GOITER.   Old records , labs and images have been reviewed.   ASSESSMENT/PLAN/RECOMMENDATIONS:   Hyperthyroidism:  -Patient is clinically thyroid -Uptake and scan in 2008 and 2009 where within normal range -Given that the nodules were hypofunctioning in 2008 - TRAB  undetectable -TFTs are normal    Medications : Methimazole 5 mg, 1 tablet 4 days a week  2.  Multinodular goiter:  -He is s/p benign FNA of the  left and the right nodules in 2008 -These nodules were hypofunctioning on uptake and scan in 2008, I was unable to look up the images as they were not available -No local neck symptoms -S/P benign FNA of the left thyroid nodule 2024  3.  Prediabetes:  -A1c has trended down from 6.0% to 5.7% -Encouraged patient to continue with lifestyle changes, avoid sugar sweetened beverages and commutes by biking  Follow-up in 6 months   Signed electronically by: Lyndle Herrlich, MD  [facility] Endocrin[facility]  [facility] [address] [place] [place], [place] [address] Phone: [phone] FAX: [phone]   CC: Corwin Levins, MD [address] [place] [address] Phone: [phone] Fax: [phone]   Return to Endocrinology clinic as below: Future Appointments  Date Time Provider Department Center  10/13/2023  9:10 AM Art Levan, Konrad Dolores, MD [id number] None

## 2023-10-13 ENCOUNTER — Ambulatory Visit: Payer: BC Managed Care – PPO | Admitting: Internal Medicine

## 2023-10-13 ENCOUNTER — Encounter: Payer: Self-pay | Admitting: Internal Medicine

## 2023-10-13 VITALS — BP 128/82 | HR 67 | Ht 73.0 in | Wt 231.0 lb

## 2023-10-13 DIAGNOSIS — R7303 Prediabetes: Secondary | ICD-10-CM | POA: Diagnosis not present

## 2023-10-13 DIAGNOSIS — E0590 Thyrotoxicosis, unspecified without thyrotoxic crisis or storm: Secondary | ICD-10-CM

## 2023-10-13 DIAGNOSIS — E042 Nontoxic multinodular goiter: Secondary | ICD-10-CM

## 2023-10-13 LAB — POCT GLYCOSYLATED HEMOGLOBIN (HGB A1C): Hemoglobin A1C: 5.7 % — AB (ref 4.0–5.6)

## 2023-10-14 ENCOUNTER — Encounter: Payer: Self-pay | Admitting: Internal Medicine

## 2023-10-14 LAB — T4, FREE: Free T4: 1.4 ng/dL (ref 0.8–1.8)

## 2023-10-14 LAB — TSH: TSH: 0.55 m[IU]/L (ref 0.40–4.50)

## 2023-10-14 MED ORDER — METHIMAZOLE 5 MG PO TABS: 5.0000 mg | ORAL_TABLET | ORAL | 3 refills | Status: DC

## 2024-03-21 ENCOUNTER — Ambulatory Visit (INDEPENDENT_AMBULATORY_CARE_PROVIDER_SITE_OTHER)

## 2024-03-21 ENCOUNTER — Ambulatory Visit: Admitting: Podiatry

## 2024-03-21 DIAGNOSIS — M722 Plantar fascial fibromatosis: Secondary | ICD-10-CM

## 2024-03-21 DIAGNOSIS — M79672 Pain in left foot: Secondary | ICD-10-CM | POA: Diagnosis not present

## 2024-03-21 DIAGNOSIS — M7732 Calcaneal spur, left foot: Secondary | ICD-10-CM

## 2024-03-21 NOTE — Progress Notes (Signed)
 Patient presents complaining of pain in the left heel has been bothering her for several months now.  It is getting worse.  Pain with standing for Singh in the morning and later on in the day.  Some pain along the arch at times.  Does not recall any injury to it.   Physical exam:  General appearance: Pleasant, and in no acute distress. AOx3.  Vascular: Pedal pulses: DP 2/4 bilaterally, PT 2/4 bilaterally.  No edema lower legs bilaterally. Capillary fill time immediate bilaterally.  Neurological: Light touch intact feet bilaterally.  Normal Achilles reflex bilaterally.  No clonus or spasticity noted.  Negative Tinel sign tarsal tunnel and porta pedis bilaterally  Dermatologic:   Skin normal temperature bilaterally.  Skin normal color, tone, and texture bilaterally.   Musculoskeletal: Plantar medial aspect heel left at plantar calcaneal tubercle medially.  No tenderness lotta compression the calcaneus.  Normal muscle strength lower extremity bilaterally.  Gastrocnemius equinus left.  No fibromas palpable.  Radiographs: 3 views left foot: Osteophytic changes plantar and posterior aspect calcaneus.  No assenting fractures or dislocations.  No erosive changes noted.  No evidence of any bone tumors.  Normal bone density.  Diagnosis: 1.  Pain left foot. 2.  Plantar fasciitis left foot. 3.  Calcaneal spur left.  Plan: -New patient office visit for evaluation and management level 3.  Modifier 25. - The discussed plan of fasciitis and etiology and treatment.  Discussed wearing a good supportive shoe.  Avoid going barefoot or wearing flat soled shoes. -Gave written oral as he can do for the plantar fasciitis. - Dispensed OTC foot orthoses bilaterally. - Dispensed night splint left. -injected 3cc 2:1 mixture 0.5 cc Marcaine:Kenolog 10mg /43ml at medial origin plantar fascia at medial calcaneal tubercle left.    Return 2 weeks follow-up injection plantar fasciitis left

## 2024-03-21 NOTE — Patient Instructions (Signed)
 Plantar Fasciitis (Heel Spur Syndrome) with Rehab The plantar fascia is a fibrous, ligament-like, soft-tissue structure that spans the bottom of the foot. Plantar fasciitis is a condition that causes pain in the foot due to inflammation of the tissue. SYMPTOMS  Pain and tenderness on the underneath side of the foot. Pain that worsens with standing or walking. CAUSES  Plantar fasciitis is caused by irritation and injury to the plantar fascia on the underneath side of the foot. Common mechanisms of injury include: Direct trauma to bottom of the foot. Damage to a small nerve that runs under the foot where the main fascia attaches to the heel bone. Stress placed on the plantar fascia due to bone spurs. RISK INCREASES WITH:  Activities that place stress on the plantar fascia (running, jumping, pivoting, or cutting). Poor strength and flexibility. Improperly fitted shoes. Tight calf muscles. Flat feet. Failure to warm-up properly before activity. Obesity. PREVENTION Warm up and stretch properly before activity. Allow for adequate recovery between workouts. Maintain physical fitness: Strength, flexibility, and endurance. Cardiovascular fitness. Maintain a health body weight. Avoid stress on the plantar fascia. Wear properly fitted shoes, including arch supports for individuals who have flat feet.  PROGNOSIS  If treated properly, then the symptoms of plantar fasciitis usually resolve without surgery. However, occasionally surgery is necessary.  RELATED COMPLICATIONS  Recurrent symptoms that may result in a chronic condition. Problems of the lower back that are caused by compensating for the injury, such as limping. Pain or weakness of the foot during push-off following surgery. Chronic inflammation, scarring, and partial or complete fascia tear, occurring more often from repeated injections.  TREATMENT  Treatment initially involves the use of ice and medication to help reduce pain and  inflammation. The use of strengthening and stretching exercises may help reduce pain with activity, especially stretches of the Achilles tendon. These exercises may be performed at home or with a therapist. Your caregiver may recommend that you use heel cups of arch supports to help reduce stress on the plantar fascia. Occasionally, corticosteroid injections are given to reduce inflammation. If symptoms persist for greater than 6 months despite non-surgical (conservative), then surgery may be recommended.   MEDICATION  If pain medication is necessary, then nonsteroidal anti-inflammatory medications, such as aspirin and ibuprofen, or other minor pain relievers, such as acetaminophen, are often recommended. Do not take pain medication within 7 days before surgery. Prescription pain relievers may be given if deemed necessary by your caregiver. Use only as directed and only as much as you need. Corticosteroid injections may be given by your caregiver. These injections should be reserved for the most serious cases, because they may only be given a certain number of times.  HEAT AND COLD Cold treatment (icing) relieves pain and reduces inflammation. Cold treatment should be applied for 10 to 15 minutes every 2 to 3 hours for inflammation and pain and immediately after any activity that aggravates your symptoms. Use ice packs or massage the area with a piece of ice (ice massage). Heat treatment may be used prior to performing the stretching and strengthening activities prescribed by your caregiver, physical therapist, or athletic trainer. Use a heat pack or soak the injury in warm water.  SEEK IMMEDIATE MEDICAL CARE IF: Treatment seems to offer no benefit, or the condition worsens. Any medications produce adverse side effects.  EXERCISES- RANGE OF MOTION (ROM) AND STRETCHING EXERCISES - Plantar Fasciitis (Heel Spur Syndrome) These exercises may help you when beginning to rehabilitate your injury. Your  symptoms may resolve with or without further involvement from your physician, physical therapist or athletic trainer. While completing these exercises, remember:  Restoring tissue flexibility helps normal motion to return to the joints. This allows healthier, less painful movement and activity. An effective stretch should be held for at least 30 seconds. A stretch should never be painful. You should only feel a gentle lengthening or release in the stretched tissue.  RANGE OF MOTION - Toe Extension, Flexion Sit with your right / left leg crossed over your opposite knee. Grasp your toes and gently pull them back toward the top of your foot. You should feel a stretch on the bottom of your toes and/or foot. Hold this stretch for 10 seconds. Now, gently pull your toes toward the bottom of your foot. You should feel a stretch on the top of your toes and or foot. Hold this stretch for 10 seconds. Repeat  times. Complete this stretch 3 times per day.   RANGE OF MOTION - Ankle Dorsiflexion, Active Assisted Remove shoes and sit on a chair that is preferably not on a carpeted surface. Place right / left foot under knee. Extend your opposite leg for support. Keeping your heel down, slide your right / left foot back toward the chair until you feel a stretch at your ankle or calf. If you do not feel a stretch, slide your bottom forward to the edge of the chair, while still keeping your heel down. Hold this stretch for 10 seconds. Repeat 3 times. Complete this stretch 2 times per day.   STRETCH  Gastroc, Standing Place hands on wall. Extend right / left leg, keeping the front knee somewhat bent. Slightly point your toes inward on your back foot. Keeping your right / left heel on the floor and your knee straight, shift your weight toward the wall, not allowing your back to arch. You should feel a gentle stretch in the right / left calf. Hold this position for 10 seconds. Repeat 3 times. Complete this  stretch 2 times per day.  STRETCH  Soleus, Standing Place hands on wall. Extend right / left leg, keeping the other knee somewhat bent. Slightly point your toes inward on your back foot. Keep your right / left heel on the floor, bend your back knee, and slightly shift your weight over the back leg so that you feel a gentle stretch deep in your back calf. Hold this position for 10 seconds. Repeat 3 times. Complete this stretch 2 times per day.  STRETCH  Gastrocsoleus, Standing  Note: This exercise can place a lot of stress on your foot and ankle. Please complete this exercise only if specifically instructed by your caregiver.  Place the ball of your right / left foot on a step, keeping your other foot firmly on the same step. Hold on to the wall or a rail for balance. Slowly lift your other foot, allowing your body weight to press your heel down over the edge of the step. You should feel a stretch in your right / left calf. Hold this position for 10 seconds. Repeat this exercise with a slight bend in your right / left knee. Repeat 3 times. Complete this stretch 2 times per day.   STRENGTHENING EXERCISES - Plantar Fasciitis (Heel Spur Syndrome)  These exercises may help you when beginning to rehabilitate your injury. They may resolve your symptoms with or without further involvement from your physician, physical therapist or athletic trainer. While completing these exercises, remember:  Muscles can  gain both the endurance and the strength needed for everyday activities through controlled exercises. Complete these exercises as instructed by your physician, physical therapist or athletic trainer. Progress the resistance and repetitions only as guided.  STRENGTH - Towel Curls Sit in a chair positioned on a non-carpeted surface. Place your foot on a towel, keeping your heel on the floor. Pull the towel toward your heel by only curling your toes. Keep your heel on the floor. Repeat 3 times.  Complete this exercise 2 times per day.  STRENGTH - Ankle Inversion Secure one end of a rubber exercise band/tubing to a fixed object (table, pole). Loop the other end around your foot just before your toes. Place your fists between your knees. This will focus your strengthening at your ankle. Slowly, pull your big toe up and in, making sure the band/tubing is positioned to resist the entire motion. Hold this position for 10 seconds. Have your muscles resist the band/tubing as it slowly pulls your foot back to the starting position. Repeat 3 times. Complete this exercises 2 times per day.  Document Released: 07/26/2005 Document Revised: 10/18/2011 Document Reviewed: 11/07/2008 [facility] Patient Information 2014 [place], [place].       For instructions on how to put on your Night Splint, please visit [email or web address]    While at your visit today you received a steroid injection in your foot or ankle to help with your pain. Along with having the steroid medication there is some numbing medication in the shot that you received. Due to this you may notice some numbness to the area for the next couple of hours.   I would recommend limiting activity for the next few days to help the steroid injection take affect.    The actually benefit from the steroid injection may take up to 2-7 days to see a difference. You may actually experience a small (as in 10%) INCREASE in pain in the first 24 hours---that is common. It would be best if you can ice the area today and take anti-inflammatory medications (such as Ibuprofen, Motrin, or Aleve) if you are able to take these medications. If you were prescribed another medication to help with the pain go ahead and start that medication today    Things to watch out for that you should contact us  or a health care provider urgently would include: 1. Unusual (as in more than 10%) increase in pain 2. New fever > 101.5 3. New swelling or redness of the  injected area.  4. Streaking of red lines around the area injected.  If you have any questions or concerns about this, please give our office a call at [phone].

## 2024-04-04 ENCOUNTER — Encounter: Payer: Self-pay | Admitting: Podiatry

## 2024-04-04 ENCOUNTER — Ambulatory Visit: Admitting: Podiatry

## 2024-04-04 DIAGNOSIS — M722 Plantar fascial fibromatosis: Secondary | ICD-10-CM

## 2024-04-04 NOTE — Progress Notes (Signed)
 Presents today follow-up Larbex left doing about 50% better.  Not nearly as sharp pain as before but still painful with walking.  Has been doing the exercises and icing.   Physical exam:  General appearance: Pleasant, and in no acute distress. AOx3.  Vascular: Pedal pulses: DP 2/4 bilaterally, PT 2/4 bilaterally. Minimal edema lower legs bilaterally.  Neurological: Negative Tinel sign tarsal tunnel and porta pedis left  Dermatologic:   Skin normal temperature bilaterally.  Skin normal color, tone, and texture bilaterally.   Musculoskeletal: Tenderness plantar medial aspect heel left.  Tenderness medial plantar calcaneal tubercle left.  Not as tender as first visit.   Diagnosis: 1.  Plantar fasciitis left  Plan: -Continue stretching and icing. -injected 3cc 2:1 mixture 0.5 cc Marcaine:Kenolog 10mg /32ml at medial origin plantar fascia at medial plantar calcaneal tubercle left.    Return 2 weeks follow-up injection plantar fascia left

## 2024-04-16 ENCOUNTER — Ambulatory Visit: Admitting: Internal Medicine

## 2024-04-16 ENCOUNTER — Encounter: Payer: Self-pay | Admitting: Internal Medicine

## 2024-04-16 VITALS — BP 120/80 | HR 72 | Ht 73.0 in | Wt 230.0 lb

## 2024-04-16 DIAGNOSIS — R7303 Prediabetes: Secondary | ICD-10-CM

## 2024-04-16 DIAGNOSIS — E0590 Thyrotoxicosis, unspecified without thyrotoxic crisis or storm: Secondary | ICD-10-CM | POA: Diagnosis not present

## 2024-04-16 DIAGNOSIS — E042 Nontoxic multinodular goiter: Secondary | ICD-10-CM | POA: Diagnosis not present

## 2024-04-16 NOTE — Progress Notes (Unsigned)
 Name: Curtis Sanders  MRN/ DOB: [id number], May 30, 1971    Age/ Sex: 53 y.o., male    PCP: Norleen Lynwood ORN, MD   Reason for Endocrinology Evaluation: Hyperthyroidism     Date of Initial Endocrinology Evaluation: 10/05/2022    HPI: [name] is a 53 y.o. male with a past medical history of MNG. The patient presented for initial endocrinology clinic visit on 10/05/2022 for consultative assistance with his Hyperthyroidism.   Patient has been diagnosed with multinodular goiter since 2008.  He is s/p benign FNA of the right and left nodules in 2008.   Patient has been noted with low TSH since 2008 with a nadir of 0.12 u IU/mL in December 2023.  Of note, the patient has always had normal T4 and T3.  He had a thyroid  uptake and scan in 2008 at with a 24-hour I-131 uptake of 25% and hyperfunctioning nodules.  He had a repeat uptake and scan in 2009 with 24-hour I-131 uptake of 8%    No XRT  No history of osteoporosis No history of cardiac arrhythmia  Mother with goiter    He was started on a small dose of methimazole  09/2022 due to fatigue, with a TSH of 0.23 u IU/mL, normal T4 and T3  SUBJECTIVE:    Today (04/16/24):  Curtis Sanders is here for a follow up on hyperthyroidism.   Weight continues to fluctuate, but overall stable  Denies local neck symptoms, no pain or dysphagia No palpitations No tremors No constipation diarrhea He has cut out sugar but not necessarily carbohydrates Continues to cycle occasionally  Methimazole  5 mg, 4 days a week ( sat, sun , tues and thur)     HISTORY:  Past Medical History:  Past Medical History:  Diagnosis Date   Allergy    GENITAL HERPES 05/12/2007   Qualifier: Diagnosis of  By: Wilhemina KRAFT, Lucy     GOITER, MULTINODULAR 09/27/2008   Qualifier: Diagnosis of  By: Kassie MD, Sean A    Hepatitis A    HLD (hyperlipidemia) 02/27/2019   HYPERTHYROIDISM 05/18/2007   Qualifier: Diagnosis of  By: Georgian ROSALEA CHARM Lamar    Past  Surgical History:  Past Surgical History:  Procedure Laterality Date   FOOT SURGERY     age 1 or 62    TONSILLECTOMY      Social History:  reports that he has quit smoking. He has never used smokeless tobacco. He reports that he does not currently use alcohol. He reports that he does not use drugs. Family History: family history includes Alcohol abuse in his maternal grandmother; Arthritis in his maternal grandmother; Asthma in his daughter; Cancer in his mother; Colon cancer (age of onset: 54) in his mother; Hearing loss in his maternal grandmother and mother; Other in his mother; Stroke in his maternal grandfather.   HOME MEDICATIONS: Allergies as of 04/16/2024   No Known Allergies      Medication List        Accurate as of April 16, 2024  8:21 AM. If you have any questions, ask your nurse or doctor.          fluconazole 200 MG tablet Commonly known as: DIFLUCAN Take by mouth.   levocetirizine 5 MG tablet Commonly known as: XYZAL  Take 1 tablet (5 mg total) by mouth every evening.   methimazole  5 MG tablet Commonly known as: TAPAZOLE  Take 1 tablet (5 mg total) by mouth as directed. 1 tablet 4 days  a week          REVIEW OF SYSTEMS: A comprehensive ROS was conducted with the patient and is negative except as per HPI    OBJECTIVE:  VS: BP 120/80 (BP Location: Left Arm, Patient Position: Sitting, Cuff Size: Normal)   Pulse 72   Ht 6' 1 (1.854 m)   Wt 230 lb (104.3 kg)   SpO2 98%   BMI 30.34 kg/m    Wt Readings from Last 3 Encounters:  04/16/24 230 lb (104.3 kg)  10/13/23 231 lb (104.8 kg)  04/15/23 228 lb (103.4 kg)     EXAM: General: Pt appears well and is in NAD  Neck: General: Supple without adenopathy. Thyroid : Right thyroid  asymmetry noted     Lungs: Clear with good BS bilat   Heart: Auscultation: RRR.  Abdomen: soft, nontender  Extremities:  BL LE: trace pretibial edema   Mental Status: Judgment, insight: Intact Orientation: Oriented to  time, place, and person Mood and affect: No depression, anxiety, or agitation     DATA REVIEWED:     Lab Results  Component Value Date   HGBA1C 5.7 (A) 10/13/2023   HGBA1C 6.0 08/06/2022   HGBA1C 6.0 02/27/2019     Latest Reference Range & Units 10/05/22 12:37  TRAB <=2.00 IU/L <1.00      Thyroid  ultrasound 04/20/2023 Estimated total number of nodules >/= 1 cm: 4   Number of spongiform nodules >/=  2 cm not described below (TR1): 0   Number of mixed cystic and solid nodules >/= 1.5 cm not described below (TR2): 0   _________________________________________________________   Nodule labeled 1 in the mid right thyroid , 3.4 cm and relatively unchanged. Nodule has undergone prior biopsy 04/06/2007.   Nodule labeled 2, inferior right thyroid , 2.2 cm x 2.0 cm x 1.8 cm. Nodule has TR 3 characteristics and meets criteria for surveillance.   Nodule labeled 3, inferior right thyroid , 1.6 cm x 1.6 cm x 1.4 cm. Nodule has TR 3 characteristics and meets criteria for surveillance.   Nodule labeled 4, mid left thyroid , 5.7 cm. Nodule has undergone prior biopsy 04/06/2007.   No adenopathy   IMPRESSION: Similar appearance of multinodular thyroid .   Right inferior thyroid  nodules (labeled 2, 3) both meet criteria for surveillance, as designated by the newly established ACR TI-RADS criteria. Surveillance ultrasound study recommended to be performed annually up to 5 years.   Nodules labeled 1 and 4 in the mid right and mid left thyroid  have undergone prior biopsy. Correlation with prior results may be useful.     FNA Left Nodule 06/29/2023    Clinical History: Nodule labeled 4, mid left thyroid , 5.7 cm. Nodule has undergone prior biopsy 04/06/2007. Specimen Submitted:  A. THYROID , LT MID, FINE NEEDLE ASPIRATION   FINAL MICROSCOPIC DIAGNOSIS: - Benign follicular nodule (Bethesda category II)      FNA 04/06/2007  INTERPRETATION(S):   THYROID , LEFT, FINE NEEDLE  ASPIRATION (SMEARS, THIN PREPARATION   AND CELL BLOCK): FINDINGS CONSISTENT WITH NONNEOPLASTIC GOITER.    FNA 04/06/2007 INTERPRETATION(S):   THYROID , RIGHT, FINE NEEDLE ASPIRATION (SMEARS, THIN   PREPARATION): FINDINGS CONSISTENT WITH NONNEOPLASTIC GOITER.   Old records , labs and images have been reviewed.   ASSESSMENT/PLAN/RECOMMENDATIONS:   Hyperthyroidism:  -Patient is clinically thyroid  -Uptake and scan in 2008 and 2009 where within normal range -Given that the nodules were hypofunctioning in 2008 - TRAB  undetectable -TFTs are normal    Medications : Methimazole  5 mg, 1 tablet 4 days a  week  2.  Multinodular goiter:  -He is s/p benign FNA of the left and the right nodules in 2008 -These nodules were hypofunctioning on uptake and scan in 2008 -S/P benign FNA of the left thyroid  nodule 2024  3.  Prediabetes:  -A1c has trended down from 6.0% to 5.7% -Encouraged patient to continue with lifestyle changes, avoid sugar sweetened beverages and commutes by biking  Follow-up in 6 months   Signed electronically by: Stefano Redgie Butts, MD  [facility]  [facility] [address] [place] [place], [place] [address] Phone: [phone] FAX: [phone]   CC: Norleen Lynwood ORN, MD [address] [place] [address] Phone: [phone] Fax: [phone]   Return to Endocrinology clinic as below: Future Appointments  Date Time Provider Department Center  04/16/2024  8:50 AM Ebony Yorio, Donell Redgie, MD [facility] None  04/18/2024  8:30 AM Christine Norleen, DPM [facility]

## 2024-04-17 ENCOUNTER — Ambulatory Visit: Payer: Self-pay | Admitting: Internal Medicine

## 2024-04-17 LAB — HEMOGLOBIN A1C
Hgb A1c MFr Bld: 5.8 % — ABNORMAL HIGH (ref <5.7)
Mean Plasma Glucose: 120 mg/dL
eAG (mmol/L): 6.6 mmol/L

## 2024-04-17 LAB — T4, FREE: Free T4: 1.3 ng/dL (ref 0.8–1.8)

## 2024-04-17 LAB — TSH: TSH: 0.4 m[IU]/L (ref 0.40–4.50)

## 2024-04-17 MED ORDER — METHIMAZOLE 5 MG PO TABS: 5.0000 mg | ORAL_TABLET | ORAL | 3 refills | Status: AC

## 2024-04-18 ENCOUNTER — Ambulatory Visit: Admitting: Podiatry

## 2024-04-18 ENCOUNTER — Ambulatory Visit
Admission: RE | Admit: 2024-04-18 | Discharge: 2024-04-18 | Disposition: A | Discharge status: Home / Self Care | Source: Ambulatory Visit | Attending: Internal Medicine | Admitting: Internal Medicine

## 2024-04-18 ENCOUNTER — Encounter: Payer: Self-pay | Admitting: Podiatry

## 2024-04-18 DIAGNOSIS — M722 Plantar fascial fibromatosis: Secondary | ICD-10-CM | POA: Diagnosis not present

## 2024-04-18 DIAGNOSIS — E042 Nontoxic multinodular goiter: Secondary | ICD-10-CM

## 2024-04-18 NOTE — Progress Notes (Signed)
 Patient presents for follow up.  He had 2 injections.  Probably about 70% better.  Worst pain is when he first gets up in the morning.   Physical exam:  General appearance: Pleasant, and in no acute distress. AOx3.  Vascular: Pedal pulses: DP 2/4 bilaterally, PT 2/4 bilaterally.  Mild edema lower legs bilaterally. Capillary fill time immediate.  Neurological: Negative Tinel's sign tarsal tunnel and porta pedis left  Dermatologic:   Skin normal temperature bilaterally.  Skin normal color, tone, and texture bilaterally.   Musculoskeletal: Tenderness plantar medial aspect heel left at the medial plantar calcaneal tubercle.  No tenderness with lateral compression of the calcaneus.  Diagnosis: 1.  Plantar fasciitis left  Plan: -Continue wearing good supportive shoes.  Will try third injection today.  Would also recommend molded orthotics orthotics for him long-term.  Will get him set up for these. -injected 3cc 2:1 mixture 0.5 cc Marcaine:Kenolog 10mg /59ml at plantar fascia origin at medial plantar calcaneal tubercle left.     Return 2 weeks for follow-up injection plantar fascia left and also schedule him with Trish for custom molded orthotics

## 2024-05-02 ENCOUNTER — Ambulatory Visit: Admitting: Podiatry

## 2024-05-02 DIAGNOSIS — M722 Plantar fascial fibromatosis: Secondary | ICD-10-CM

## 2024-05-02 MED ORDER — PREDNISONE 5 MG PO TABS: ORAL_TABLET | ORAL | 0 refills | Status: AC

## 2024-05-02 NOTE — Progress Notes (Signed)
 Patient presents follow-up plantar fasciitis left.  Doing much better.  Just a little soreness at this point.   Physical exam:  General appearance: Pleasant, and in no acute distress. AOx3.  Vascular: Pedal pulses: DP 2/4 bilaterally, PT 2/4 bilaterally. Mild edema lower legs bilaterally.   Neurological: Negative Tinel sign tarsal tunnel and porta pedis left  Dermatologic:   Skin normal temperature bilaterally.  Skin normal color, tone, and texture bilaterally.   Musculoskeletal: Some soreness plantar medial aspect heel at medial calcaneal tubercle.  No tenderness lateral compression of calcaneus.    Diagnosis: 1.  Plantar fasciitis left  Plan: -Established office visit for evaluation management level 3 -Rx prednisone  5 mg, 30 mg p.o. daily first day, then decrease by 5 mg every other day for 12 days -Continue icing and stretching and Voltaren gel as needed - Wear good supportive shoes.  Return as needed

## 2024-05-14 ENCOUNTER — Ambulatory Visit

## 2024-05-14 NOTE — Progress Notes (Signed)
 Orthotics   Patient was present and evaluated for Custom molded foot orthotics. Patient will benefit from CFO's to provide total contact to BIL MLA's helping to balance and distribute body weight more evenly across BIL feet helping to reduce plantar pressure and pain. Orthotic will also encourage FF / RF alignment  Patient was scanned today and will return for fitting upon receipt  $1250 deductible with nothing met patient is aware of oop cost   Lolita Schultze Cped, CFo, CFm

## 2024-05-29 ENCOUNTER — Telehealth: Payer: Self-pay

## 2024-05-29 NOTE — Telephone Encounter (Signed)
 Orthotics are here Charges not entered Financial form signed and on file Appt set for 11/3 for fitting/ pu

## 2024-06-11 ENCOUNTER — Ambulatory Visit (INDEPENDENT_AMBULATORY_CARE_PROVIDER_SITE_OTHER)

## 2024-06-11 DIAGNOSIS — M7732 Calcaneal spur, left foot: Secondary | ICD-10-CM | POA: Diagnosis not present

## 2024-06-11 DIAGNOSIS — M2141 Flat foot [pes planus] (acquired), right foot: Secondary | ICD-10-CM | POA: Diagnosis not present

## 2024-06-11 DIAGNOSIS — M2142 Flat foot [pes planus] (acquired), left foot: Secondary | ICD-10-CM

## 2024-06-11 DIAGNOSIS — M722 Plantar fascial fibromatosis: Secondary | ICD-10-CM

## 2024-06-11 NOTE — Progress Notes (Signed)
 Patient presents today to pick up custom molded foot orthotics, diagnosed with PF - BIL and bone spur Left  by Dr. Christine .   Orthotics were dispensed and fit was satisfactory. Reviewed instructions for break-in and wear. Written instructions given to patient.  Patient will follow up as needed.  Lolita Schultze Cped

## 2024-10-15 ENCOUNTER — Ambulatory Visit: Admitting: Internal Medicine
